# Patient Record
Sex: Female | Born: 1965 | Race: White | Hispanic: No | Marital: Married | Smoking: Never smoker
Health system: Southern US, Community
[De-identification: ages and names within clinical notes are randomized; demographics above are authoritative.]

## PROBLEM LIST (undated history)

## (undated) DIAGNOSIS — K5090 Crohn's disease, unspecified, without complications: Secondary | ICD-10-CM

## (undated) DIAGNOSIS — I10 Essential (primary) hypertension: Secondary | ICD-10-CM

## (undated) DIAGNOSIS — L503 Dermatographic urticaria: Secondary | ICD-10-CM

## (undated) DIAGNOSIS — K219 Gastro-esophageal reflux disease without esophagitis: Secondary | ICD-10-CM

## (undated) DIAGNOSIS — M3320 Polymyositis, organ involvement unspecified: Secondary | ICD-10-CM

## (undated) DIAGNOSIS — M1990 Unspecified osteoarthritis, unspecified site: Secondary | ICD-10-CM

## (undated) DIAGNOSIS — K529 Noninfective gastroenteritis and colitis, unspecified: Secondary | ICD-10-CM

## (undated) DIAGNOSIS — Z87442 Personal history of urinary calculi: Secondary | ICD-10-CM

## (undated) HISTORY — PX: UPPER GASTROINTESTINAL ENDOSCOPY: SHX188

## (undated) HISTORY — DX: Dermatographic urticaria: L50.3

## (undated) HISTORY — DX: Essential (primary) hypertension: I10

## (undated) HISTORY — DX: Crohn's disease, unspecified, without complications: K50.90

## (undated) HISTORY — DX: Noninfective gastroenteritis and colitis, unspecified: K52.9

## (undated) HISTORY — PX: WISDOM TOOTH EXTRACTION: SHX21

---

## 1985-07-23 HISTORY — PX: APPENDECTOMY: SHX54

## 2003-12-22 ENCOUNTER — Emergency Department (HOSPITAL_COMMUNITY): Admission: EM | Admit: 2003-12-22 | Discharge: 2003-12-22 | Payer: Self-pay | Admitting: Emergency Medicine

## 2004-08-20 ENCOUNTER — Emergency Department (HOSPITAL_COMMUNITY): Admission: EM | Admit: 2004-08-20 | Discharge: 2004-08-20 | Payer: Self-pay | Admitting: Emergency Medicine

## 2004-09-04 ENCOUNTER — Ambulatory Visit: Payer: Self-pay | Admitting: Internal Medicine

## 2004-10-19 ENCOUNTER — Ambulatory Visit: Payer: Self-pay | Admitting: Internal Medicine

## 2004-11-01 ENCOUNTER — Ambulatory Visit (HOSPITAL_COMMUNITY): Admission: RE | Admit: 2004-11-01 | Discharge: 2004-11-01 | Payer: Self-pay | Admitting: Internal Medicine

## 2005-06-06 ENCOUNTER — Ambulatory Visit: Payer: Self-pay | Admitting: Internal Medicine

## 2006-05-30 ENCOUNTER — Ambulatory Visit: Payer: Self-pay | Admitting: Internal Medicine

## 2006-06-10 ENCOUNTER — Ambulatory Visit: Payer: Self-pay | Admitting: Internal Medicine

## 2012-08-23 DIAGNOSIS — L503 Dermatographic urticaria: Secondary | ICD-10-CM

## 2012-08-23 HISTORY — DX: Dermatographic urticaria: L50.3

## 2013-09-21 ENCOUNTER — Ambulatory Visit (INDEPENDENT_AMBULATORY_CARE_PROVIDER_SITE_OTHER): Payer: BC Managed Care – PPO | Admitting: Internal Medicine

## 2013-09-21 ENCOUNTER — Encounter (INDEPENDENT_AMBULATORY_CARE_PROVIDER_SITE_OTHER): Payer: Self-pay | Admitting: Internal Medicine

## 2013-09-21 VITALS — BP 104/70 | HR 84 | Temp 98.8°F | Ht 63.0 in | Wt 120.5 lb

## 2013-09-21 DIAGNOSIS — K5090 Crohn's disease, unspecified, without complications: Secondary | ICD-10-CM

## 2013-09-21 NOTE — Patient Instructions (Signed)
CBC, CRP. Further recommendation to follow.

## 2013-09-21 NOTE — Progress Notes (Signed)
Subjective:     Patient ID: Kristen Cooper, female   DOB: [birth year], 48 y.o.   MRN: [id number]  HPI Three weeks ago she had pain rt mid abdomen. The pain last about 24 hrs. She took Oxycodone and phenergan x 1 day.  She put her self on a liquid diet. She thinks this was a Crohn's flare.  Last Sunday, she had a small amt rt mid abdomen. She does have some bloating. She thinks this is maybe her 3rd flare. She denies any pain for over a week. She is on a liquid diet with some solids.  She has a BM daily. Her stools are either loose or they are formed which is normal for her. Appetite is good for the most part. No weight loss.  She feels 95% better. She has been off the Pentasa about 5 yrs ago. She took herself off her medication.  She does not want to go back on the Pentasa.  Hx of small bowel Crohn's disease. She was diagnosed in 1987 per Dr. Olevia Perches notes.  Last seen in January of 2010. Review of Systems     Past Medical History  Diagnosis Date  . Inflammatory bowel disease   . Crohn disease   . Hypertension     Past Surgical History  Procedure Laterality Date  . Appendectomy  1987    No Known Allergies  No current outpatient prescriptions on file prior to visit.   No current facility-administered medications on file prior to visit.     Objective:   Physical Exam  Filed Vitals:   09/21/13 1553  BP: 104/70  Pulse: 84  Temp: 98.8 F (37.1 C)  Height: 5' 3"  (1.6 m)  Weight: 120 lb 8 oz (54.658 kg)   Alert and oriented. Skin warm and dry. Oral mucosa is moist.   . Sclera anicteric, conjunctivae is pink. Thyroid not enlarged. No cervical lymphadenopathy. Lungs clear. Heart regular rate and rhythm.  Abdomen is soft. Bowel sounds are positive. No hepatomegaly. No abdominal masses felt. No tenderness.  No edema to lower extremities.      Assessment:   Crohn's disease. Today she c/o some bloating. No abdominal pain at this time.    Plan:    CBC, CRP. Further recommendations  to follow.  She was taking Pentasa 519m, 2 caps four times a day.  Has been off for about 5 yrs. If she is having a flair, will restart if she is agreeable.

## 2013-09-22 LAB — CBC WITH DIFFERENTIAL/PLATELET
Basophils Absolute: 0.1 10*3/uL (ref 0.0–0.1)
Basophils Relative: 1 % (ref 0–1)
EOS ABS: 0.1 10*3/uL (ref 0.0–0.7)
EOS PCT: 1 % (ref 0–5)
HEMATOCRIT: 42.6 % (ref 36.0–46.0)
HEMOGLOBIN: 14.6 g/dL (ref 12.0–15.0)
LYMPHS ABS: 3.1 10*3/uL (ref 0.7–4.0)
LYMPHS PCT: 44 % (ref 12–46)
MCH: 31.5 pg (ref 26.0–34.0)
MCHC: 34.3 g/dL (ref 30.0–36.0)
MCV: 92 fL (ref 78.0–100.0)
MONO ABS: 0.6 10*3/uL (ref 0.1–1.0)
MONOS PCT: 8 % (ref 3–12)
Neutro Abs: 3.2 10*3/uL (ref 1.7–7.7)
Neutrophils Relative %: 46 % (ref 43–77)
Platelets: 383 10*3/uL (ref 150–400)
RBC: 4.63 MIL/uL (ref 3.87–5.11)
RDW: 13.5 % (ref 11.5–15.5)
WBC: 7 10*3/uL (ref 4.0–10.5)

## 2013-09-22 LAB — C-REACTIVE PROTEIN: CRP: <0.5 mg/dL (ref <0.60)

## 2013-09-25 ENCOUNTER — Other Ambulatory Visit (INDEPENDENT_AMBULATORY_CARE_PROVIDER_SITE_OTHER): Payer: Self-pay | Admitting: Internal Medicine

## 2013-09-25 DIAGNOSIS — K5090 Crohn's disease, unspecified, without complications: Secondary | ICD-10-CM

## 2013-09-25 MED ORDER — MESALAMINE ER 500 MG PO CPCR: 1000.0000 mg | ORAL_CAPSULE | Freq: Four times a day (QID) | ORAL | Status: DC

## 2014-05-24 ENCOUNTER — Other Ambulatory Visit (INDEPENDENT_AMBULATORY_CARE_PROVIDER_SITE_OTHER): Payer: Self-pay | Admitting: Internal Medicine

## 2014-05-27 ENCOUNTER — Telehealth (INDEPENDENT_AMBULATORY_CARE_PROVIDER_SITE_OTHER): Payer: Self-pay | Admitting: Internal Medicine

## 2014-05-27 MED ORDER — MESALAMINE ER 500 MG PO CPCR: ORAL_CAPSULE | ORAL | Status: DC

## 2014-05-27 NOTE — Telephone Encounter (Signed)
Rx eprescribed.

## 2014-06-30 ENCOUNTER — Encounter (INDEPENDENT_AMBULATORY_CARE_PROVIDER_SITE_OTHER): Payer: Self-pay | Admitting: *Deleted

## 2014-09-29 ENCOUNTER — Encounter (INDEPENDENT_AMBULATORY_CARE_PROVIDER_SITE_OTHER): Payer: Self-pay | Admitting: *Deleted

## 2014-10-11 ENCOUNTER — Ambulatory Visit (INDEPENDENT_AMBULATORY_CARE_PROVIDER_SITE_OTHER): Payer: BC Managed Care – PPO | Admitting: Internal Medicine

## 2014-11-03 ENCOUNTER — Other Ambulatory Visit (INDEPENDENT_AMBULATORY_CARE_PROVIDER_SITE_OTHER): Payer: Self-pay | Admitting: Internal Medicine

## 2014-11-29 ENCOUNTER — Ambulatory Visit (INDEPENDENT_AMBULATORY_CARE_PROVIDER_SITE_OTHER): Payer: BC Managed Care – PPO | Admitting: Internal Medicine

## 2014-11-29 ENCOUNTER — Encounter (INDEPENDENT_AMBULATORY_CARE_PROVIDER_SITE_OTHER): Payer: Self-pay | Admitting: Internal Medicine

## 2014-11-29 VITALS — BP 110/68 | HR 83 | Temp 98.8°F | Resp 18 | Ht 63.0 in | Wt 124.6 lb

## 2014-11-29 DIAGNOSIS — K5090 Crohn's disease, unspecified, without complications: Secondary | ICD-10-CM

## 2014-11-29 DIAGNOSIS — K5000 Crohn's disease of small intestine without complications: Secondary | ICD-10-CM

## 2014-11-29 LAB — COMPREHENSIVE METABOLIC PANEL
ALBUMIN: 4 g/dL (ref 3.5–5.2)
ALT: 24 U/L (ref 0–35)
AST: 22 U/L (ref 0–37)
Alkaline Phosphatase: 57 U/L (ref 39–117)
BILIRUBIN TOTAL: 0.8 mg/dL (ref 0.2–1.2)
BUN: 16 mg/dL (ref 6–23)
CALCIUM: 9.3 mg/dL (ref 8.4–10.5)
CHLORIDE: 102 meq/L (ref 96–112)
CO2: 27 meq/L (ref 19–32)
Creat: 0.87 mg/dL (ref 0.50–1.10)
GLUCOSE: 80 mg/dL (ref 70–99)
Potassium: 3.8 mEq/L (ref 3.5–5.3)
SODIUM: 141 meq/L (ref 135–145)
TOTAL PROTEIN: 7 g/dL (ref 6.0–8.3)

## 2014-11-29 LAB — CBC
HEMATOCRIT: 43.7 % (ref 36.0–46.0)
HEMOGLOBIN: 14.6 g/dL (ref 12.0–15.0)
MCH: 30.1 pg (ref 26.0–34.0)
MCHC: 33.4 g/dL (ref 30.0–36.0)
MCV: 90.1 fL (ref 78.0–100.0)
MPV: 9.1 fL (ref 8.6–12.4)
Platelets: 384 10*3/uL (ref 150–400)
RBC: 4.85 MIL/uL (ref 3.87–5.11)
RDW: 12.9 % (ref 11.5–15.5)
WBC: 8.9 10*3/uL (ref 4.0–10.5)

## 2014-11-29 LAB — C-REACTIVE PROTEIN: CRP: 1.2 mg/dL — AB (ref <0.60)

## 2014-11-29 MED ORDER — MESALAMINE ER 500 MG PO CPCR: ORAL_CAPSULE | ORAL | Status: DC

## 2014-11-29 NOTE — Patient Instructions (Signed)
Physician will call with results of blood tests when completed. Please keep symptom diary as discussed. Call if he have diarrhea abdominal pain lasting for more than 2-3 days.

## 2014-11-29 NOTE — Progress Notes (Signed)
Presenting complaint;  Follow-up for Crohn's disease.  Subjective:  Kristen Cooper is 49 year old Caucasian female with history of small bowel Crohn's disease is here for scheduled visit. She was last seen in March 2015. She states she is doing well. On most days she has normal stools. Every now and then she has diarrhea and if she stays on liquid diet she gets better after a day or two. She denies melena or rectal bleeding. She has good appetite and her waist been stable. She does not take OTC NSAIDs. She did not get yearly blood work offered to her through the school this year.   Current Medications: Outpatient Encounter Prescriptions as of 11/29/2014  Medication Sig  . acetaminophen (TYLENOL) 325 MG tablet Take 650 mg by mouth as needed.  . cetirizine (ZYRTEC) 10 MG tablet Take 10 mg by mouth daily.  . fexofenadine (ALLEGRA) 180 MG tablet Take 180 mg by mouth daily.  . Levonorgestrel-Ethinyl Estrad (AVIANE PO) Take by mouth daily.  Marland Kitchen lisinopril-hydrochlorothiazide (PRINZIDE,ZESTORETIC) 10-12.5 MG per tablet Take 1 tablet by mouth daily.  . mesalamine (PENTASA) 500 MG CR capsule TAKE (2) CAPSULES FOUR TIMES DAILY.  . [DISCONTINUED] PENTASA 500 MG CR capsule TAKE (2) CAPSULES FOUR TIMES DAILY. (Patient not taking: Reported on 11/29/2014)   No facility-administered encounter medications on file as of 11/29/2014.     Objective: Blood pressure 110/68, pulse 83, temperature 98.8 F (37.1 C), temperature source Oral, resp. rate 18, height 5' 3"  (1.6 m), weight 124 lb 9.6 oz (56.518 kg). Patient is alert and in no acute distress. Conjunctiva is pink. Sclera is nonicteric Oropharyngeal mucosa is normal. No neck masses or thyromegaly noted. Cardiac exam with regular rhythm normal S1 and S2. No murmur or gallop noted. Lungs are clear to auscultation. Abdomen is symmetrical and soft with mild tenderness at RLQ on deep palpation. No organomegaly or masses. No LE edema or clubbing noted.  Labs/studies  Results: Lab data from 09/21/2013  WBC 7.0, H&H 14.6 and 42.6 and platelet count 383K  CRP less than 0.5.    Assessment:  #1. Small bowel Crohn's disease. She appears to be in remission. She is presently on oral mesalamine. She is due for lab studies.  Plan:  New prescription given for Pentasa 1 g by mouth 4 times a day one month with 11 refills. Patient will go to the lab for CBC, comprehensive chemistry panel and CRP. Office visit in one year unless symptoms relapse. She will undergo screening colonoscopy next year.

## 2015-08-18 ENCOUNTER — Encounter (INDEPENDENT_AMBULATORY_CARE_PROVIDER_SITE_OTHER): Payer: Self-pay | Admitting: Internal Medicine

## 2015-09-06 ENCOUNTER — Other Ambulatory Visit (INDEPENDENT_AMBULATORY_CARE_PROVIDER_SITE_OTHER): Payer: Self-pay | Admitting: Internal Medicine

## 2015-09-06 ENCOUNTER — Ambulatory Visit (INDEPENDENT_AMBULATORY_CARE_PROVIDER_SITE_OTHER): Payer: BC Managed Care – PPO | Admitting: Internal Medicine

## 2015-09-06 ENCOUNTER — Encounter (INDEPENDENT_AMBULATORY_CARE_PROVIDER_SITE_OTHER): Payer: Self-pay | Admitting: Internal Medicine

## 2015-09-06 ENCOUNTER — Encounter (INDEPENDENT_AMBULATORY_CARE_PROVIDER_SITE_OTHER): Payer: Self-pay | Admitting: *Deleted

## 2015-09-06 ENCOUNTER — Other Ambulatory Visit (INDEPENDENT_AMBULATORY_CARE_PROVIDER_SITE_OTHER): Payer: Self-pay | Admitting: *Deleted

## 2015-09-06 VITALS — BP 104/58 | HR 72 | Temp 98.5°F | Ht 63.0 in | Wt 115.3 lb

## 2015-09-06 DIAGNOSIS — K5000 Crohn's disease of small intestine without complications: Secondary | ICD-10-CM

## 2015-09-06 NOTE — Progress Notes (Signed)
   Subjective:    Patient ID: Kristen Cooper, female    DOB: August 06, 1965, 50 y.o.   MRN: [id number]  HPI  Presents today with c/o problems eating. She tells me she has a lot of heartburn.  She says she feels better today.  She has some diarrhea. No blood.  She has not had a fever.  She is having diarrhea. She is having about 5-6 stools a day.  Usually has one stool today. She says she has a twinge in her rt mid abdomen off and on. Her appetite is not good. She says she is not hungry. She cannot eat raw vegetables. She has lost about 9 pounds in the past 2 weeks. She thinks she had a stomach virus last week and the week before. She has some weakness. Per records she was diagnosed with Crohn's in 1982. Hx of chronic, vague rt lower quadrant abdominal pain.    Hx of Crohn's disease and is maintained on Pentasa 2 tabs qid. She is due for a screening colonoscopy.  Review of Systems Past Medical History  Diagnosis Date  . Inflammatory bowel disease   . Crohn disease ([place])   . Hypertension   . Dermatography 08/2012    To many histamine cells    Past Surgical History  Procedure Laterality Date  . Appendectomy  1987  . Colonoscopy    . Upper gastrointestinal endoscopy      No Known Allergies  Current Outpatient Prescriptions on File Prior to Visit  Medication Sig Dispense Refill  . acetaminophen (TYLENOL) 325 MG tablet Take 650 mg by mouth as needed.    . cetirizine (ZYRTEC) 10 MG tablet Take 10 mg by mouth daily.    . fexofenadine (ALLEGRA) 180 MG tablet Take 180 mg by mouth daily.    . Levonorgestrel-Ethinyl Estrad (AVIANE PO) Take by mouth daily.    Marland Kitchen lisinopril-hydrochlorothiazide (PRINZIDE,ZESTORETIC) 10-12.5 MG per tablet Take 1 tablet by mouth daily.    . mesalamine (PENTASA) 500 MG CR capsule TAKE (2) CAPSULES FOUR TIMES DAILY. (Patient taking differently: 500 mg. TAKE (2) CAPSULES FOUR TIMES DAILY.) 240 capsule 11   No current facility-administered medications on file prior to  visit.        Objective:   Physical Exam Blood pressure 104/58, pulse 72, temperature 98.5 F (36.9 C), height 5\' 3"  (1.6 m), weight 115 lb 4.8 oz (52.3 kg). Alert and oriented. Skin warm and dry. Oral mucosa is moist.   . Sclera anicteric, conjunctivae is pink. Thyroid not enlarged. No cervical lymphadenopathy. Lungs clear. Heart regular rate and rhythm.  Abdomen is soft. Bowel sounds are positive. No hepatomegaly. No abdominal masses felt.Very slight tenderness rt mid abdomen..  No edema to lower extremities.          Assessment & Plan:  Diarrhea. Slight pain rt mid abdomen. Will get a CBC and sedrate.  Imodium as needed for the diarrhea. Will schedule a screening colonoscopy 2-3 months out.

## 2015-09-06 NOTE — Patient Instructions (Addendum)
CBC, sedrate.  Imodium as needed for diarrhea.

## 2015-09-06 NOTE — Telephone Encounter (Signed)
Patient needs trilyte 

## 2015-09-07 LAB — CBC WITH DIFFERENTIAL/PLATELET
BASOS ABS: 0 10*3/uL (ref 0.0–0.1)
BASOS PCT: 0 % (ref 0–1)
EOS ABS: 0.1 10*3/uL (ref 0.0–0.7)
Eosinophils Relative: 1 % (ref 0–5)
HCT: 36.7 % (ref 36.0–46.0)
Hemoglobin: 12 g/dL (ref 12.0–15.0)
Lymphocytes Relative: 27 % (ref 12–46)
Lymphs Abs: 2.5 10*3/uL (ref 0.7–4.0)
MCH: 28.8 pg (ref 26.0–34.0)
MCHC: 32.7 g/dL (ref 30.0–36.0)
MCV: 88 fL (ref 78.0–100.0)
MPV: 9.6 fL (ref 8.6–12.4)
Monocytes Absolute: 0.7 10*3/uL (ref 0.1–1.0)
Monocytes Relative: 8 % (ref 3–12)
NEUTROS ABS: 5.8 10*3/uL (ref 1.7–7.7)
NEUTROS PCT: 64 % (ref 43–77)
PLATELETS: 480 10*3/uL — AB (ref 150–400)
RBC: 4.17 MIL/uL (ref 3.87–5.11)
RDW: 13.2 % (ref 11.5–15.5)
WBC: 9.1 10*3/uL (ref 4.0–10.5)

## 2015-09-07 LAB — C-REACTIVE PROTEIN: CRP: 3.9 mg/dL — AB (ref <0.60)

## 2015-09-07 MED ORDER — PEG 3350-KCL-NA BICARB-NACL 420 G PO SOLR: 4000.0000 mL | Freq: Once | ORAL | Status: DC

## 2015-09-08 ENCOUNTER — Telehealth (INDEPENDENT_AMBULATORY_CARE_PROVIDER_SITE_OTHER): Payer: Self-pay | Admitting: Internal Medicine

## 2015-09-08 NOTE — Telephone Encounter (Signed)
Ms. Kamphuis left a message saying she'd like a phone call so she can get the results of her most recent labs. Please give her a call.  Pt's ph# [phone]  Thank you.

## 2015-09-13 NOTE — Telephone Encounter (Signed)
I have talked with patient

## 2015-09-16 ENCOUNTER — Telehealth (INDEPENDENT_AMBULATORY_CARE_PROVIDER_SITE_OTHER): Payer: Self-pay | Admitting: Internal Medicine

## 2015-09-16 NOTE — Telephone Encounter (Signed)
Error

## 2015-09-19 NOTE — Telephone Encounter (Signed)
Continue the Cipro 500mg  BID, and flagyl 250mg  TID. Prednisone 30mg  and taper by 5mg  weekslyl She tellls me today she feels better.  No abdominal pain. If anything changes she will call me

## 2015-10-27 ENCOUNTER — Ambulatory Visit (HOSPITAL_COMMUNITY)
Admission: RE | Admit: 2015-10-27 | Discharge: 2015-10-27 | Disposition: A | Discharge status: Home / Self Care | Payer: BC Managed Care – PPO | Source: Ambulatory Visit | Attending: Internal Medicine | Admitting: Internal Medicine

## 2015-10-27 ENCOUNTER — Encounter (HOSPITAL_COMMUNITY): Payer: Self-pay

## 2015-10-27 ENCOUNTER — Encounter (HOSPITAL_COMMUNITY)
Admission: RE | Disposition: A | Discharge status: Home / Self Care | Payer: Self-pay | Source: Ambulatory Visit | Attending: Internal Medicine

## 2015-10-27 DIAGNOSIS — Z8379 Family history of other diseases of the digestive system: Secondary | ICD-10-CM | POA: Insufficient documentation

## 2015-10-27 DIAGNOSIS — K633 Ulcer of intestine: Secondary | ICD-10-CM

## 2015-10-27 DIAGNOSIS — D125 Benign neoplasm of sigmoid colon: Secondary | ICD-10-CM | POA: Insufficient documentation

## 2015-10-27 DIAGNOSIS — Z7952 Long term (current) use of systemic steroids: Secondary | ICD-10-CM | POA: Insufficient documentation

## 2015-10-27 DIAGNOSIS — K6389 Other specified diseases of intestine: Secondary | ICD-10-CM | POA: Diagnosis not present

## 2015-10-27 DIAGNOSIS — K644 Residual hemorrhoidal skin tags: Secondary | ICD-10-CM | POA: Insufficient documentation

## 2015-10-27 DIAGNOSIS — I10 Essential (primary) hypertension: Secondary | ICD-10-CM | POA: Diagnosis not present

## 2015-10-27 DIAGNOSIS — K5000 Crohn's disease of small intestine without complications: Secondary | ICD-10-CM

## 2015-10-27 DIAGNOSIS — Z793 Long term (current) use of hormonal contraceptives: Secondary | ICD-10-CM | POA: Insufficient documentation

## 2015-10-27 DIAGNOSIS — Z79899 Other long term (current) drug therapy: Secondary | ICD-10-CM | POA: Insufficient documentation

## 2015-10-27 DIAGNOSIS — Z1211 Encounter for screening for malignant neoplasm of colon: Secondary | ICD-10-CM | POA: Diagnosis not present

## 2015-10-27 DIAGNOSIS — K5080 Crohn's disease of both small and large intestine without complications: Secondary | ICD-10-CM | POA: Diagnosis not present

## 2015-10-27 HISTORY — PX: COLONOSCOPY: SHX5424

## 2015-10-27 LAB — COMPREHENSIVE METABOLIC PANEL
ALT: 19 U/L (ref 14–54)
AST: 20 U/L (ref 15–41)
Albumin: 3.8 g/dL (ref 3.5–5.0)
Alkaline Phosphatase: 52 U/L (ref 38–126)
Anion gap: 11 (ref 5–15)
BILIRUBIN TOTAL: 1.3 mg/dL — AB (ref 0.3–1.2)
BUN: 15 mg/dL (ref 6–20)
CO2: 21 mmol/L — ABNORMAL LOW (ref 22–32)
CREATININE: 1 mg/dL (ref 0.44–1.00)
Calcium: 8.4 mg/dL — ABNORMAL LOW (ref 8.9–10.3)
Chloride: 107 mmol/L (ref 101–111)
GFR calc Af Amer: >60 mL/min (ref >60)
Glucose, Bld: 78 mg/dL (ref 65–99)
Potassium: 3.2 mmol/L — ABNORMAL LOW (ref 3.5–5.1)
Sodium: 139 mmol/L (ref 135–145)
TOTAL PROTEIN: 6.8 g/dL (ref 6.5–8.1)

## 2015-10-27 LAB — C-REACTIVE PROTEIN: CRP: 0.8 mg/dL (ref <1.0)

## 2015-10-27 SURGERY — COLONOSCOPY
Anesthesia: Moderate Sedation

## 2015-10-27 MED ORDER — MEPERIDINE HCL 50 MG/ML IJ SOLN
INTRAMUSCULAR | Status: DC | PRN
  Administered 2015-10-27 (×2): 25 mg via INTRAVENOUS

## 2015-10-27 MED ORDER — SODIUM CHLORIDE 0.9 % IV SOLN
INTRAVENOUS | Status: DC
  Administered 2015-10-27: 11:00:00 via INTRAVENOUS

## 2015-10-27 MED ORDER — MIDAZOLAM HCL 5 MG/5ML IJ SOLN
INTRAMUSCULAR | Status: DC
Start: 2015-10-27 — End: 2015-10-27
  Filled 2015-10-27: qty 10

## 2015-10-27 MED ORDER — PREDNISONE 10 MG PO TABS: 10.0000 mg | ORAL_TABLET | Freq: Every day | ORAL | Status: AC

## 2015-10-27 MED ORDER — STERILE WATER FOR IRRIGATION IR SOLN
Status: DC | PRN
  Administered 2015-10-27: 11:00:00

## 2015-10-27 MED ORDER — MEPERIDINE HCL 50 MG/ML IJ SOLN
INTRAMUSCULAR | Status: AC
  Filled 2015-10-27: qty 1

## 2015-10-27 MED ORDER — MIDAZOLAM HCL 5 MG/5ML IJ SOLN
INTRAMUSCULAR | Status: DC | PRN
  Administered 2015-10-27 (×2): 2 mg via INTRAVENOUS
  Administered 2015-10-27: 1 mg via INTRAVENOUS
  Administered 2015-10-27: 2 mg via INTRAVENOUS
  Administered 2015-10-27: 1 mg via INTRAVENOUS
  Administered 2015-10-27: 2 mg via INTRAVENOUS

## 2015-10-27 NOTE — Discharge Instructions (Signed)
Increase prednisone to 10 mg by mouth daily with breakfast. Resume other medications as before. Calcium with vitamin D two tablets daily(calcium dose 1000 mg per day). No driving for 24 hours. Physician will call with results of biopsy and blood test.   Colonoscopy, Care After Refer to this sheet in the next few weeks. These instructions provide you with information on caring for yourself after your procedure. Your health care provider may also give you more specific instructions. Your treatment has been planned according to current medical practices, but problems sometimes occur. Call your health care provider if you have any problems or questions after your procedure. WHAT TO EXPECT AFTER THE PROCEDURE  After your procedure, it is typical to have the following:  A small amount of blood in your stool.  Moderate amounts of gas and mild abdominal cramping or bloating. HOME CARE INSTRUCTIONS  Do not drive, operate machinery, or sign important documents for 24 hours.  You may shower and resume your regular physical activities, but move at a slower pace for the first 24 hours.  Take frequent rest periods for the first 24 hours.  Walk around or put a warm pack on your abdomen to help reduce abdominal cramping and bloating.  Drink enough fluids to keep your urine clear or pale yellow.  You may resume your normal diet as instructed by your health care provider. Avoid heavy or fried foods that are hard to digest.  Avoid drinking alcohol for 24 hours or as instructed by your health care provider.  Only take over-the-counter or prescription medicines as directed by your health care provider.  If a tissue sample (biopsy) was taken during your procedure:  Do not take aspirin or blood thinners for 7 days, or as instructed by your health care provider.  Do not drink alcohol for 7 days, or as instructed by your health care provider.  Eat soft foods for the first 24 hours. SEEK MEDICAL CARE  IF: You have persistent spotting of blood in your stool 2-3 days after the procedure. SEEK IMMEDIATE MEDICAL CARE IF:  You have more than a small spotting of blood in your stool.  You pass large blood clots in your stool.  Your abdomen is swollen (distended).  You have nausea or vomiting.  You have a fever.  You have increasing abdominal pain that is not relieved with medicine.   This information is not intended to replace advice given to you by your health care provider. Make sure you discuss any questions you have with your health care provider.   Document Released: 02/21/2004 Document Revised: 04/29/2013 Document Reviewed: 03/16/2013 Elsevier Interactive Patient Education [employer].

## 2015-10-27 NOTE — H&P (Signed)
Kristen Cooper is an 50 y.o. female.   Chief Complaint: Patient is here for colonoscopy. HPI: She is 50 year old Caucasian female who is here for colonoscopy for screening/surveillance purposes. She has history of small bowel Crohn's disease which was diagnosed, appendectomy 1987. She was treated for relapse about 8 weeks ago. She is down to prednisone 5 mg daily. She denies abdominal pain rectal bleeding. She has intermittent diarrhea. She states she had lost close to 20 pounds and she may have gained some back. Her appetite is improved. She does not take OTC NSAIDs. Family History significant for Crohn disease in mother.  Past Medical History  Diagnosis Date  . Inflammatory bowel disease   . Crohn disease ([place])   . Hypertension   . Dermatography 08/2012    To many histamine cells    Past Surgical History  Procedure Laterality Date  . Appendectomy  1987  . Colonoscopy    . Upper gastrointestinal endoscopy      Family History  Problem Relation Age of Onset  . Crohn's disease Mother   . Irritable bowel syndrome Father   . Hypertension Brother   . Healthy Brother   . Healthy Brother    Social History:  reports that she has never smoked. She has never used smokeless tobacco. She reports that she does not drink alcohol or use illicit drugs.  Allergies: No Known Allergies  Medications Prior to Admission  Medication Sig Dispense Refill  . cetirizine (ZYRTEC) 10 MG tablet Take 10 mg by mouth daily.    . fexofenadine (ALLEGRA) 180 MG tablet Take 180 mg by mouth daily.    . Levonorgestrel-Ethinyl Estrad (AVIANE PO) Take by mouth daily.    Marland Kitchen lisinopril-hydrochlorothiazide (PRINZIDE,ZESTORETIC) 10-12.5 MG per tablet Take 1 tablet by mouth daily.    . mesalamine (PENTASA) 500 MG CR capsule TAKE (2) CAPSULES FOUR TIMES DAILY. (Patient taking differently: 500 mg. TAKE (2) CAPSULES FOUR TIMES DAILY.) 240 capsule 11  . polyethylene glycol-electrolytes (NULYTELY/GOLYTELY) 420 g solution Take  4,000 mLs by mouth once. (Patient taking differently: Take 4,000 mLs by mouth once. Prior to colonoscopy) 4000 mL 0  . predniSONE (DELTASONE) 10 MG tablet Take 10 mg by mouth daily with breakfast. Taking taper.  Is down to 1 pill daily thru Saturday    . acetaminophen (TYLENOL) 325 MG tablet Take 650 mg by mouth as needed.      No results found for this or any previous visit (from the past 48 hour(s)). No results found.  ROS  Blood pressure 115/64, pulse 110, temperature 98.7 F (37.1 C), temperature source Oral, resp. rate 12, height 5' 2"  (1.575 m), weight 105 lb (47.628 kg), last menstrual period 10/10/2015, SpO2 100 %. Physical Exam  Constitutional:  Well-developed thin Caucasian female in NAD.  HENT:  Mouth/Throat: Oropharynx is clear and moist.  Eyes: Conjunctivae are normal. No scleral icterus.  Neck: No thyromegaly present.  Cardiovascular: Normal rate, regular rhythm and normal heart sounds.   No murmur heard. Respiratory: Effort normal and breath sounds normal.  GI:  Abdomen is flat and soft with mild tenderness at RLQ.  Musculoskeletal: She exhibits no edema.  Lymphadenopathy:    She has no cervical adenopathy.  Neurological: She is alert.  Skin: Skin is warm and dry.     Assessment/Plan History of small bowel Crohn's disease. Screening/surveillance colonoscopy.  Rogene Houston, MD 10/27/2015, 10:58 AM

## 2015-10-27 NOTE — Op Note (Signed)
[facility] Patient Name: Kristen Cooper Procedure Date: 10/27/2015 10:53 AM MRN: [id number] Date of Birth: 04/17/1966 Attending MD: Hildred Laser , MD CSN: [id number] Age: 50 Admit Type: Outpatient Procedure:                Colonoscopy Indications:              Screening for colorectal malignant neoplasm, High                            risk colon cancer surveillance: Crohn's disease                            small intestine Providers:                Hildred Laser, MD, Rometta Emery, RN, Bonnetta Barry, Technician Referring MD:             Kassie Mends PA-C Medicines:                Meperidine 50 mg IV, Midazolam 10 mg IV Complications:            No immediate complications. Estimated Blood Loss:     Estimated blood loss was minimal. Procedure:                Pre-Anesthesia Assessment:                           - Prior to the procedure, a History and Physical                            was performed, and patient medications and                            allergies were reviewed. The patient's tolerance of                            previous anesthesia was also reviewed. The risks                            and benefits of the procedure and the sedation                            options and risks were discussed with the patient.                            All questions were answered, and informed consent                            was obtained. Prior Anticoagulants: The patient has                            taken no previous anticoagulant or antiplatelet  agents. ASA Grade Assessment: II - A patient with                            mild systemic disease. After reviewing the risks                            and benefits, the patient was deemed in                            satisfactory condition to undergo the procedure.                           After obtaining informed consent, the colonoscope                            was passed  under direct vision. Throughout the                            procedure, the patient's blood pressure, pulse, and                            oxygen saturations were monitored continuously. The                            EC-[id number] ([id number]) scope was introduced through                            the anus and advanced to the the terminal ileum,                            with identification of the appendiceal orifice and                            IC valve. The colonoscopy was somewhat difficult                            due to a tortuous colon. Successful completion of                            the procedure was aided by changing the patient to                            a supine position. The terminal ileum, ileocecal                            valve, appendiceal orifice, and rectum were                            photographed. The quality of the bowel preparation                            was adequate. Scope In: 11:11:36 AM Scope Out: 46:56:81 AM Scope Withdrawal Time: 0 hours 18 minutes 50 seconds  Total Procedure Duration: 0 hours 37 minutes 33 seconds  Findings:      The terminal ileum contained a few non-bleeding erosions. No stigmata of       recent bleeding were seen. Biopsies were taken with a cold forceps for       histology.      A [Extent] area of [Severity] [Features] mucosa was found in the cecum.      A scattered area of erythematous, eroded and ulcerated mucosa was found       in the sigmoid colon, in the descending colon, in the transverse colon,       in the ascending colon and in the cecum. Biopsies were taken with a cold       forceps for histology.      A 6 mm polyp was found in the sigmoid colon. The polyp was sessile. The       polyp was removed with a cold snare. Resection and retrieval were       complete.      External hemorrhoids were found during retroflexion. The hemorrhoids       were small. Impression:               - A few erosions in the terminal  ileum. Biopsied.                           - Erythematous, eroded and ulcerated mucosa in the                            sigmoid colon, in the descending colon, in the                            transverse colon, in the ascending colon and in the                            cecum. Biopsied.                           - One 6 mm polyp in the sigmoid colon, removed with                            a cold snare. Resected and retrieved.                           - External hemorrhoids.                           - Biopsies taken from terminal ileum, cecum and                            sigmoid colon. Moderate Sedation:      Moderate (conscious) sedation was administered by the endoscopy nurse       and supervised by the endoscopist. The following parameters were       monitored: oxygen saturation, heart rate, blood pressure, CO2       capnography and response to care. Total physician intraservice time was       45 minutes. Recommendation:           - Resume  previous diet today.                           - Continue present medicationsbut increase                            prednisone to 10 mg by mouth daily.                           - Await pathology results.                           - No aspirin, ibuprofen, naproxen, or other                            non-steroidal anti-inflammatory drugs.                           - Will repeat CRP today along with chemistry panel                            and TPMT assay.                           - Repeat colonoscopy is recommended. The                            colonoscopy date will be determined after pathology                            results from today's exam become available for                            review. Procedure Code(s):        --- Professional ---                           [phone], Colonoscopy, flexible; with removal of                            tumor(s), polyp(s), or other lesion(s) by snare                            technique                            45380, 59, Colonoscopy, flexible; with biopsy,                            single or multiple                           99152, Moderate sedation services provided by the                            same physician or other qualified health care  professional performing the diagnostic or                            therapeutic service that the sedation supports,                            requiring the presence of an independent trained                            observer to assist in the monitoring of the                            patient's level of consciousness and physiological                            status; initial 15 minutes of intraservice time,                            patient age 77 years or older                           [phone], Moderate sedation services; each additional                            15 minutes intraservice time                           [phone], Moderate sedation services; each additional                            15 minutes intraservice time Diagnosis Code(s):        --- Professional ---                           Z12.11, Encounter for screening for malignant                            neoplasm of colon                           K50.00, Crohn's disease of small intestine without                            complications                           K63.3, Ulcer of intestine                           K63.89, Other specified diseases of intestine                           D12.5, Benign neoplasm of sigmoid colon                           K64.4, Residual hemorrhoidal skin tags CPT copyright 2016 American Medical Association. All  rights reserved. The codes documented in this report are preliminary and upon coder review may  be revised to meet current compliance requirements. Hildred Laser, MD Hildred Laser, MD 10/27/2015 12:05:45 PM This report has been signed electronically. Number of Addenda: 0

## 2015-10-31 ENCOUNTER — Encounter (HOSPITAL_COMMUNITY): Payer: Self-pay | Admitting: Internal Medicine

## 2015-11-01 LAB — MISC LABCORP TEST (SEND OUT): Labcorp test code: 510750

## 2015-11-06 ENCOUNTER — Other Ambulatory Visit (INDEPENDENT_AMBULATORY_CARE_PROVIDER_SITE_OTHER): Payer: Self-pay | Admitting: Internal Medicine

## 2015-11-06 MED ORDER — POTASSIUM CHLORIDE CRYS ER 20 MEQ PO TBCR: 20.0000 meq | EXTENDED_RELEASE_TABLET | Freq: Every day | ORAL | Status: DC

## 2015-11-07 ENCOUNTER — Telehealth (INDEPENDENT_AMBULATORY_CARE_PROVIDER_SITE_OTHER): Payer: Self-pay | Admitting: *Deleted

## 2015-11-07 DIAGNOSIS — K50918 Crohn's disease, unspecified, with other complication: Secondary | ICD-10-CM

## 2015-11-07 NOTE — Telephone Encounter (Signed)
Per Dr.Rehman the patient will need to have labs drawn in 2 weeks. 

## 2015-11-08 ENCOUNTER — Telehealth (INDEPENDENT_AMBULATORY_CARE_PROVIDER_SITE_OTHER): Payer: Self-pay | Admitting: Internal Medicine

## 2015-11-08 NOTE — Telephone Encounter (Signed)
Patient

## 2015-11-08 NOTE — Telephone Encounter (Signed)
Message forwarded to Dr.Rehman as the patient would like to talk with him about the Remicade.

## 2015-11-08 NOTE — Telephone Encounter (Signed)
Patient called, wanted to touch base with Dr. Laural Golden to decide what treatment to use.  She'd like to try Remicade, but has some questions.  She would like to discuss.  She'll be in a meeting until 4pm, after that she'll be at home.  Home - [phone] Work - [phone]

## 2015-11-09 NOTE — Telephone Encounter (Signed)
Patient's call returned. Patient would like to proceed with Remicade both for induction and maintenance. Will check hepatitis B surface antigen HCV antibody. She will have PPD through PCPs office. Patient will stop by tomorrow morning to pick up the paperwork.

## 2015-11-10 ENCOUNTER — Telehealth (INDEPENDENT_AMBULATORY_CARE_PROVIDER_SITE_OTHER): Payer: Self-pay | Admitting: *Deleted

## 2015-11-10 ENCOUNTER — Encounter (INDEPENDENT_AMBULATORY_CARE_PROVIDER_SITE_OTHER): Payer: Self-pay | Admitting: *Deleted

## 2015-11-10 DIAGNOSIS — K50918 Crohn's disease, unspecified, with other complication: Secondary | ICD-10-CM

## 2015-11-10 NOTE — Telephone Encounter (Signed)
The process has been started for this patient. We are also awaiting the results of her PPD , Hep B surface antigen and HCV antibody. Patient will be made aware as we rec'v outcome of PA.

## 2015-11-10 NOTE — Telephone Encounter (Signed)
Per Dr.Rehman the patient is going to start Remicade. She is going to have PPD @ PCP. Hep B Surface Antigen,HCV Antibody have been ordered.  I called  [employer] /Damien S. A PA was done over phone. Tempory reference # is [id number]. Dates of pending approval are 11/10/15-11/09/16. He is going to fax a from for [place] to complete and send clinical information.  We have rec'd additional paper work from [employer] to complete and send clinicals. We are also awaiting PPD , and additional lab results.

## 2015-11-10 NOTE — Telephone Encounter (Signed)
Per Dr.Rehman the patient will need to have labs drawn. 

## 2015-11-10 NOTE — Addendum Note (Signed)
Addended by: Grayland Ormond on: 11/10/2015 08:10 AM   Modules accepted: Orders

## 2015-11-11 LAB — HEPATITIS C ANTIBODY: HCV Ab: NEGATIVE

## 2015-11-11 LAB — HEPATITIS B SURFACE ANTIGEN: Hepatitis B Surface Ag: NEGATIVE

## 2015-11-16 ENCOUNTER — Encounter (INDEPENDENT_AMBULATORY_CARE_PROVIDER_SITE_OTHER): Payer: Self-pay | Admitting: *Deleted

## 2015-11-16 ENCOUNTER — Other Ambulatory Visit (INDEPENDENT_AMBULATORY_CARE_PROVIDER_SITE_OTHER): Payer: Self-pay | Admitting: *Deleted

## 2015-11-16 DIAGNOSIS — K50918 Crohn's disease, unspecified, with other complication: Secondary | ICD-10-CM

## 2015-11-22 LAB — BASIC METABOLIC PANEL
BUN: 24 mg/dL (ref 7–25)
CO2: 22 mmol/L (ref 20–31)
Calcium: 9.5 mg/dL (ref 8.6–10.4)
Chloride: 103 mmol/L (ref 98–110)
Creat: 0.97 mg/dL (ref 0.50–1.05)
Glucose, Bld: 100 mg/dL — ABNORMAL HIGH (ref 65–99)
Potassium: 3.9 mmol/L (ref 3.5–5.3)
Sodium: 140 mmol/L (ref 135–146)

## 2015-11-24 ENCOUNTER — Telehealth (INDEPENDENT_AMBULATORY_CARE_PROVIDER_SITE_OTHER): Payer: Self-pay | Admitting: *Deleted

## 2015-11-24 NOTE — Telephone Encounter (Signed)
Addressed; See Ms.Todd's note

## 2015-11-24 NOTE — Telephone Encounter (Addendum)
Patient was called and it was explained that she was having another lab to see if her K+ had come up,after having taken Potassium following last lab results. She understood. She did ask if she was to refill her Prednisone she finished what she in on either tomorrow or Saturday.  Also , her first Remicade is on May 9 th , the same day she has a followup with Terri. Patient will go to have Remicade. Dr.Rehman will be made aware and we will see when he wants her to be seen after having been on Remicade.

## 2015-11-24 NOTE — Telephone Encounter (Signed)
Dr.Rehman, Do you want the patient to refill her Prednisone. She will be completing current Rx either tomorrow or Saturday? She is to have her first Remicade Infusion on May 9 th. When would you like to see her in office for a follow-up visit?

## 2015-11-29 ENCOUNTER — Encounter (HOSPITAL_COMMUNITY): Payer: Self-pay

## 2015-11-29 ENCOUNTER — Encounter (HOSPITAL_COMMUNITY)
Admission: RE | Admit: 2015-11-29 | Discharge: 2015-11-29 | Disposition: A | Discharge status: Home / Self Care | Payer: BC Managed Care – PPO | Source: Ambulatory Visit | Attending: Internal Medicine | Admitting: Internal Medicine

## 2015-11-29 ENCOUNTER — Encounter (INDEPENDENT_AMBULATORY_CARE_PROVIDER_SITE_OTHER): Payer: Self-pay

## 2015-11-29 DIAGNOSIS — K5090 Crohn's disease, unspecified, without complications: Secondary | ICD-10-CM | POA: Diagnosis present

## 2015-11-29 MED ORDER — SODIUM CHLORIDE 0.9 % IV SOLN
300.0000 mg | Freq: Once | INTRAVENOUS | Status: AC
  Administered 2015-11-29: 300 mg via INTRAVENOUS
  Filled 2015-11-29: qty 30

## 2015-11-29 MED ORDER — ACETAMINOPHEN 325 MG PO TABS
650.0000 mg | ORAL_TABLET | Freq: Once | ORAL | Status: AC
  Administered 2015-11-29: 650 mg via ORAL

## 2015-11-29 MED ORDER — SODIUM CHLORIDE 0.9 % IV SOLN
INTRAVENOUS | Status: DC
  Administered 2015-11-29: 10:00:00 via INTRAVENOUS

## 2015-11-29 MED ORDER — LORATADINE 10 MG PO TABS: 10.0000 mg | ORAL_TABLET | Freq: Every day | ORAL | Status: DC

## 2015-11-29 MED ORDER — ACETAMINOPHEN 325 MG PO TABS
ORAL_TABLET | ORAL | Status: AC
  Filled 2015-11-29: qty 2

## 2015-11-30 ENCOUNTER — Ambulatory Visit (INDEPENDENT_AMBULATORY_CARE_PROVIDER_SITE_OTHER): Payer: BC Managed Care – PPO | Admitting: Internal Medicine

## 2015-12-06 ENCOUNTER — Other Ambulatory Visit (INDEPENDENT_AMBULATORY_CARE_PROVIDER_SITE_OTHER): Payer: Self-pay | Admitting: Internal Medicine

## 2015-12-13 ENCOUNTER — Other Ambulatory Visit (INDEPENDENT_AMBULATORY_CARE_PROVIDER_SITE_OTHER): Payer: Self-pay | Admitting: Internal Medicine

## 2015-12-13 ENCOUNTER — Encounter (HOSPITAL_COMMUNITY)
Admission: RE | Admit: 2015-12-13 | Discharge: 2015-12-13 | Disposition: A | Discharge status: Home / Self Care | Payer: BC Managed Care – PPO | Source: Ambulatory Visit | Attending: Internal Medicine | Admitting: Internal Medicine

## 2015-12-13 DIAGNOSIS — K5090 Crohn's disease, unspecified, without complications: Secondary | ICD-10-CM | POA: Diagnosis not present

## 2015-12-13 MED ORDER — ACETAMINOPHEN 325 MG PO TABS
650.0000 mg | ORAL_TABLET | Freq: Once | ORAL | Status: AC
  Administered 2015-12-13: 650 mg via ORAL
  Filled 2015-12-13: qty 2

## 2015-12-13 MED ORDER — SODIUM CHLORIDE 0.9 % IV SOLN
5.0000 mg/kg | INTRAVENOUS | Status: DC
  Administered 2015-12-13: 300 mg via INTRAVENOUS
  Filled 2015-12-13: qty 30

## 2015-12-13 MED ORDER — SODIUM CHLORIDE 0.9 % IV SOLN
INTRAVENOUS | Status: DC
  Administered 2015-12-13: 10:00:00 via INTRAVENOUS

## 2016-01-10 ENCOUNTER — Encounter (HOSPITAL_COMMUNITY)
Admission: RE | Admit: 2016-01-10 | Discharge: 2016-01-10 | Disposition: A | Discharge status: Home / Self Care | Payer: BC Managed Care – PPO | Source: Ambulatory Visit | Attending: Internal Medicine | Admitting: Internal Medicine

## 2016-01-10 ENCOUNTER — Encounter (HOSPITAL_COMMUNITY): Payer: Self-pay

## 2016-01-10 DIAGNOSIS — K50918 Crohn's disease, unspecified, with other complication: Secondary | ICD-10-CM | POA: Diagnosis not present

## 2016-01-10 MED ORDER — SODIUM CHLORIDE 0.9 % IV SOLN
INTRAVENOUS | Status: DC
  Administered 2016-01-10: 250 mL via INTRAVENOUS

## 2016-01-10 MED ORDER — ACETAMINOPHEN 325 MG PO TABS
650.0000 mg | ORAL_TABLET | Freq: Once | ORAL | Status: AC
  Administered 2016-01-10: 650 mg via ORAL
  Filled 2016-01-10: qty 2

## 2016-01-10 MED ORDER — SODIUM CHLORIDE 0.9 % IV SOLN
5.0000 mg/kg | INTRAVENOUS | Status: DC
  Administered 2016-01-10: 300 mg via INTRAVENOUS
  Filled 2016-01-10: qty 30

## 2016-01-10 NOTE — Discharge Instructions (Signed)
Infliximab injection  What is this medicine?  INFLIXIMAB (in FLIX i mab) is used to treat Crohn's disease and ulcerative colitis. It is also used to treat ankylosing spondylitis, psoriasis, and some forms of arthritis.  This medicine may be used for other purposes; ask your health care provider or pharmacist if you have questions.  What should I tell my health care provider before I take this medicine?  They need to know if you have any of these conditions:  -diabetes  -exposure to tuberculosis  -heart failure  -hepatitis or liver disease  -immune system problems  -infection  -lung or breathing disease, like COPD  -multiple sclerosis  -current or past resident of [place]  -seizure disorder  -an unusual or allergic reaction to infliximab, mouse proteins, other medicines, foods, dyes, or preservatives  -pregnant or trying to get pregnant  -breast-feeding  How should I use this medicine?  This medicine is for injection into a vein. It is usually given by a health care professional in a hospital or clinic setting.  A special MedGuide will be given to you by the pharmacist with each prescription and refill. Be sure to read this information carefully each time.  Talk to your pediatrician regarding the use of this medicine in children. Special care may be needed.  Overdosage: If you think you have taken too much of this medicine contact a poison control center or emergency room at once.  NOTE: This medicine is only for you. Do not share this medicine with others.  What if I miss a dose?  It is important not to miss your dose. Call your doctor or health care professional if you are unable to keep an appointment.  What may interact with this medicine?  Do not take this medicine with any of the following medications:  -anakinra  -rilonacept  This medicine may also interact with the following medications:  -vaccines  This list may not describe all possible interactions. Give your health care provider  a list of all the medicines, herbs, non-prescription drugs, or dietary supplements you use. Also tell them if you smoke, drink alcohol, or use illegal drugs. Some items may interact with your medicine.  What should I watch for while using this medicine?  Visit your doctor or health care professional for regular checks on your progress.  If you get a cold or other infection while receiving this medicine, call your doctor or health care professional. Do not treat yourself. This medicine may decrease your body's ability to fight infections. Before beginning therapy, your doctor may do a test to see if you have been exposed to tuberculosis.  This medicine may make the symptoms of heart failure worse in some patients. If you notice symptoms such as increased shortness of breath or swelling of the ankles or legs, contact your health care provider right away.  If you are going to have surgery or dental work, tell your health care professional or dentist that you have received this medicine.  If you take this medicine for plaque psoriasis, stay out of the sun. If you cannot avoid being in the sun, wear protective clothing and use sunscreen. Do not use sun lamps or tanning beds/booths.  What side effects may I notice from receiving this medicine?  Side effects that you should report to your doctor or health care professional as soon as possible:  -allergic reactions like skin rash, itching or hives, swelling of the face, lips, or tongue  -chest pain  -  fever or chills, usually related to the infusion  -muscle or joint pain  -red, scaly patches or raised bumps on the skin  -signs of infection - fever or chills, cough, sore throat, pain or difficulty passing urine  -swollen lymph nodes in the neck, underarm, or groin areas  -unexplained weight loss  -unusual bleeding or bruising  -unusually weak or tired  -yellowing of the eyes or skin  Side effects that usually do not require medical attention (report to your doctor or health  care professional if they continue or are bothersome):  -headache  -heartburn or stomach pain  -nausea, vomiting  This list may not describe all possible side effects. Call your doctor for medical advice about side effects. You may report side effects to FDA at 1-800-FDA-1088.  Where should I keep my medicine?  This drug is given in a hospital or clinic and will not be stored at home.  NOTE: This sheet is a summary. It may not cover all possible information. If you have questions about this medicine, talk to your doctor, pharmacist, or health care provider.     © 2016, [employer]. (2008-02-25 10:26:02)

## 2016-03-06 ENCOUNTER — Encounter (HOSPITAL_COMMUNITY)
Admission: RE | Admit: 2016-03-06 | Discharge: 2016-03-06 | Disposition: A | Discharge status: Home / Self Care | Payer: BC Managed Care – PPO | Source: Ambulatory Visit | Attending: Internal Medicine | Admitting: Internal Medicine

## 2016-03-06 DIAGNOSIS — K5090 Crohn's disease, unspecified, without complications: Secondary | ICD-10-CM | POA: Insufficient documentation

## 2016-03-06 MED ORDER — SODIUM CHLORIDE 0.9 % IV SOLN
5.0000 mg/kg | INTRAVENOUS | Status: DC
  Administered 2016-03-06: 300 mg via INTRAVENOUS
  Filled 2016-03-06: qty 20

## 2016-03-06 MED ORDER — ACETAMINOPHEN 325 MG PO TABS
ORAL_TABLET | ORAL | Status: AC
  Filled 2016-03-06: qty 2

## 2016-03-06 MED ORDER — SODIUM CHLORIDE 0.9 % IV SOLN
Freq: Once | INTRAVENOUS | Status: AC
  Administered 2016-03-06: 09:00:00 via INTRAVENOUS

## 2016-03-06 MED ORDER — LORATADINE 10 MG PO TABS: 10.0000 mg | ORAL_TABLET | Freq: Once | ORAL | Status: DC

## 2016-03-06 MED ORDER — ACETAMINOPHEN 325 MG PO TABS
650.0000 mg | ORAL_TABLET | Freq: Once | ORAL | Status: AC
  Administered 2016-03-06: 650 mg via ORAL

## 2016-05-01 ENCOUNTER — Encounter (HOSPITAL_COMMUNITY): Payer: Self-pay

## 2016-05-01 ENCOUNTER — Encounter (HOSPITAL_COMMUNITY)
Admission: RE | Admit: 2016-05-01 | Discharge: 2016-05-01 | Disposition: A | Discharge status: Home / Self Care | Payer: BC Managed Care – PPO | Source: Ambulatory Visit | Attending: Internal Medicine | Admitting: Internal Medicine

## 2016-05-01 DIAGNOSIS — K5090 Crohn's disease, unspecified, without complications: Secondary | ICD-10-CM | POA: Diagnosis present

## 2016-05-01 MED ORDER — ACETAMINOPHEN 325 MG PO TABS
650.0000 mg | ORAL_TABLET | Freq: Once | ORAL | Status: AC
  Administered 2016-05-01: 650 mg via ORAL

## 2016-05-01 MED ORDER — ACETAMINOPHEN 325 MG PO TABS
ORAL_TABLET | ORAL | Status: AC
  Filled 2016-05-01: qty 2

## 2016-05-01 MED ORDER — SODIUM CHLORIDE 0.9 % IV SOLN
INTRAVENOUS | Status: DC
  Administered 2016-05-01: 09:00:00 via INTRAVENOUS

## 2016-05-01 MED ORDER — SODIUM CHLORIDE 0.9 % IV SOLN
5.0000 mg/kg | INTRAVENOUS | Status: DC
  Administered 2016-05-01: 300 mg via INTRAVENOUS
  Filled 2016-05-01: qty 10

## 2016-05-01 NOTE — Progress Notes (Signed)
Pt states she took her allegra at home before coming to hospital.

## 2016-06-04 ENCOUNTER — Telehealth (INDEPENDENT_AMBULATORY_CARE_PROVIDER_SITE_OTHER): Payer: Self-pay | Admitting: Internal Medicine

## 2016-06-04 NOTE — Telephone Encounter (Signed)
Patient was called and made aware that per Dr.Rehman it is okay to take Flu Vaccination.

## 2016-06-04 NOTE — Telephone Encounter (Signed)
Patient's son Quillian Quince called, stated that Dr. Laural Golden has the patient on a medication therapy at the hospital.  He wants to know if the patient can have a flu shot.  [phone]

## 2016-06-25 ENCOUNTER — Telehealth (INDEPENDENT_AMBULATORY_CARE_PROVIDER_SITE_OTHER): Payer: Self-pay | Admitting: *Deleted

## 2016-06-26 ENCOUNTER — Encounter (HOSPITAL_COMMUNITY): Payer: BC Managed Care – PPO

## 2016-06-27 NOTE — Telephone Encounter (Signed)
A PA is going to have to be updated . The current PA for Remicade will expire 07/22/2016.

## 2016-07-19 ENCOUNTER — Encounter (HOSPITAL_COMMUNITY): Payer: Self-pay

## 2016-07-19 ENCOUNTER — Encounter (HOSPITAL_COMMUNITY)
Admission: RE | Admit: 2016-07-19 | Discharge: 2016-07-19 | Disposition: A | Discharge status: Home / Self Care | Payer: BC Managed Care – PPO | Source: Ambulatory Visit | Attending: Internal Medicine | Admitting: Internal Medicine

## 2016-07-19 DIAGNOSIS — K5090 Crohn's disease, unspecified, without complications: Secondary | ICD-10-CM | POA: Diagnosis not present

## 2016-07-19 MED ORDER — ACETAMINOPHEN 325 MG PO TABS: 650.0000 mg | ORAL_TABLET | Freq: Once | ORAL | Status: DC | PRN

## 2016-07-19 MED ORDER — SODIUM CHLORIDE 0.9 % IV SOLN
5.0000 mg/kg | Freq: Once | INTRAVENOUS | Status: AC
  Administered 2016-07-19: 300 mg via INTRAVENOUS
  Filled 2016-07-19: qty 30

## 2016-07-19 MED ORDER — LORATADINE 10 MG PO TABS: 10.0000 mg | ORAL_TABLET | Freq: Once | ORAL | Status: DC | PRN

## 2016-07-19 MED ORDER — SODIUM CHLORIDE 0.9 % IV SOLN
Freq: Once | INTRAVENOUS | Status: AC
  Administered 2016-07-19: 10:00:00 via INTRAVENOUS

## 2016-08-02 ENCOUNTER — Other Ambulatory Visit (INDEPENDENT_AMBULATORY_CARE_PROVIDER_SITE_OTHER): Payer: Self-pay | Admitting: Internal Medicine

## 2016-09-13 ENCOUNTER — Encounter (HOSPITAL_COMMUNITY)
Admission: RE | Admit: 2016-09-13 | Discharge: 2016-09-13 | Disposition: A | Discharge status: Home / Self Care | Payer: BC Managed Care – PPO | Source: Ambulatory Visit | Attending: Internal Medicine | Admitting: Internal Medicine

## 2016-09-13 DIAGNOSIS — K5090 Crohn's disease, unspecified, without complications: Secondary | ICD-10-CM | POA: Insufficient documentation

## 2016-09-13 MED ORDER — ACETAMINOPHEN 325 MG PO TABS: 650.0000 mg | ORAL_TABLET | Freq: Once | ORAL | Status: DC

## 2016-09-13 MED ORDER — SODIUM CHLORIDE 0.9 % IV SOLN
300.0000 mg | INTRAVENOUS | Status: DC
  Administered 2016-09-13: 300 mg via INTRAVENOUS
  Filled 2016-09-13: qty 30

## 2016-09-13 MED ORDER — SODIUM CHLORIDE 0.9 % IV SOLN
Freq: Once | INTRAVENOUS | Status: AC
  Administered 2016-09-13: 250 mL via INTRAVENOUS

## 2016-09-13 MED ORDER — LORATADINE 10 MG PO TABS: 10.0000 mg | ORAL_TABLET | Freq: Once | ORAL | Status: DC

## 2016-09-25 ENCOUNTER — Telehealth (INDEPENDENT_AMBULATORY_CARE_PROVIDER_SITE_OTHER): Payer: Self-pay | Admitting: Internal Medicine

## 2016-09-25 NOTE — Telephone Encounter (Signed)
Patient called, stated that she received a letter from her insurance stating that they will no longer cover her Remicade.    [phone]

## 2016-09-26 NOTE — Telephone Encounter (Signed)
A Pa was completed for this patient, approval on patient's chart. Patient was called and a message was left.

## 2016-10-23 ENCOUNTER — Other Ambulatory Visit: Payer: Self-pay | Admitting: Internal Medicine

## 2016-10-23 DIAGNOSIS — M6281 Muscle weakness (generalized): Secondary | ICD-10-CM

## 2016-11-02 ENCOUNTER — Telehealth (INDEPENDENT_AMBULATORY_CARE_PROVIDER_SITE_OTHER): Payer: Self-pay | Admitting: *Deleted

## 2016-11-02 NOTE — Telephone Encounter (Signed)
Records faxed.

## 2016-11-02 NOTE — Telephone Encounter (Signed)
Due to patient's insurance, she will be getting her Remicade Infusions at [facility]. Kristen Cooper from [facility] has called. They are needing her office notes and lab work form [place] , for the continuation of care.  Fax number is [phone]

## 2016-11-05 ENCOUNTER — Ambulatory Visit
Admission: RE | Admit: 2016-11-05 | Discharge: 2016-11-05 | Disposition: A | Discharge status: Home / Self Care | Payer: BC Managed Care – PPO | Source: Ambulatory Visit | Attending: Internal Medicine | Admitting: Internal Medicine

## 2016-11-05 DIAGNOSIS — M6281 Muscle weakness (generalized): Secondary | ICD-10-CM

## 2016-11-08 ENCOUNTER — Encounter (HOSPITAL_COMMUNITY): Payer: BC Managed Care – PPO

## 2016-11-26 ENCOUNTER — Ambulatory Visit: Payer: Self-pay | Admitting: General Surgery

## 2016-11-26 NOTE — H&P (Signed)
History of Present Illness Ralene Ok MD; 11/26/2016 1:35 PM) The patient is a 51 year old female who presents with a complaint of Muscle weakness. The patient is a 51 year old female who is referred by Dr Wendy Poet for evaluation of muscle weakness/muscle biopsy. Patient comes in today with a several month history of muscle weakness, and stiffness. Patient states that she has Crohn's disease and currently is taking Medicaid. She also appears to have arthritis which is recently diagnosed and being treated.     Past Surgical History Malachy Moan, [place]; 11/26/2016 1:28 PM) Appendectomy  Colon Polyp Removal - Colonoscopy   Diagnostic Studies History Malachy Moan, RMA; 11/26/2016 1:28 PM) Colonoscopy  1-5 years ago Mammogram  within last year  Allergies Malachy Moan, RMA; 11/26/2016 1:29 PM) No Known Allergies 11/26/2016  Medication History Malachy Moan, RMA; 11/26/2016 1:30 PM) Pentasa (500MG Capsule ER, Oral) Active. Lisinopril (10MG Tablet, Oral) Active. Tylenol Extended Release (650MG Tablet ER, Oral) Active. ZyrTEC (10MG Tablet, Oral) Active. Allegra Allergy (180MG Tablet, Oral) Active. Remicade (100MG For Solution, Intravenous) Active. TraMADol HCl (50MG Tablet, Oral) Active. Medications Reconciled  Social History Malachy Moan, [place]; 11/26/2016 1:28 PM) Alcohol use  Occasional alcohol use. Caffeine use  Tea. No drug use  Tobacco use  Never smoker.  Family History Malachy Moan, [place]; 11/26/2016 1:28 PM) Breast Cancer  Mother. Hypertension  Brother, Father, Mother. Ischemic Bowel Disease  Father, Mother.  Pregnancy / Birth History Malachy Moan, [place]; 11/26/2016 1:29 PM) Age at menarche  50 years. Contraceptive History  Oral contraceptives. Gravida  0 Irregular periods  Para  0  Other Problems Malachy Moan, RMA; 11/26/2016 1:28 PM) Arthritis  Back Pain  Crohn's Disease  High blood pressure     Review of  Systems Malachy Moan RMA; 11/26/2016 1:29 PM) General Present- Appetite Loss, Fatigue and Night Sweats. Not Present- Chills, Fever, Weight Gain and Weight Loss. Skin Not Present- Change in Wart/Mole, Dryness, Hives, Jaundice, New Lesions, Non-Healing Wounds, Rash and Ulcer. HEENT Present- Oral Ulcers, Seasonal Allergies and Wears glasses/contact lenses. Not Present- Earache, Hearing Loss, Hoarseness, Nose Bleed, Ringing in the Ears, Sinus Pain, Sore Throat, Visual Disturbances and Yellow Eyes. Respiratory Not Present- Bloody sputum, Chronic Cough, Difficulty Breathing, Snoring and Wheezing. Breast Not Present- Breast Mass, Breast Pain, Nipple Discharge and Skin Changes. Cardiovascular Not Present- Chest Pain, Difficulty Breathing Lying Down, Leg Cramps, Palpitations, Rapid Heart Rate, Shortness of Breath and Swelling of Extremities. Gastrointestinal Present- Nausea. Not Present- Abdominal Pain, Bloating, Bloody Stool, Change in Bowel Habits, Chronic diarrhea, Constipation, Difficulty Swallowing, Excessive gas, Gets full quickly at meals, Hemorrhoids, Indigestion, Rectal Pain and Vomiting. Female Genitourinary Not Present- Frequency, Nocturia, Painful Urination, Pelvic Pain and Urgency. Musculoskeletal Present- Joint Pain, Joint Stiffness, Muscle Weakness and Swelling of Extremities. Not Present- Back Pain and Muscle Pain. Neurological Not Present- Decreased Memory, Fainting, Headaches, Numbness, Seizures, Tingling, Tremor, Trouble walking and Weakness. Psychiatric Not Present- Anxiety, Bipolar, Change in Sleep Pattern, Depression, Fearful and Frequent crying. Endocrine Present- Hot flashes. Not Present- Cold Intolerance, Excessive Hunger, Hair Changes, Heat Intolerance and New Diabetes. Hematology Not Present- Blood Thinners, Easy Bruising, Excessive bleeding, Gland problems, HIV and Persistent Infections.  Vitals Malachy Moan RMA; 11/26/2016 1:31 PM) 11/26/2016 1:30 PM Weight: 112.8 lb  Height: 63in Body Surface Area: 1.52 m Body Mass Index: 19.98 kg/m  Temp.: 98.33F  Pulse: 118 (Regular)  BP: 120/70 (Sitting, Left Arm, Standard)       Physical Exam Ralene Ok, MD; 11/26/2016 1:35 PM) General Mental Status-Alert. General  Appearance-Consistent with stated age. Hydration-Well hydrated. Voice-Normal.  Chest and Lung Exam Chest and lung exam reveals -quiet, even and easy respiratory effort with no use of accessory muscles and on auscultation, normal breath sounds, no adventitious sounds and normal vocal resonance. Inspection Chest Wall - Normal. Back - normal.  Cardiovascular Cardiovascular examination reveals -normal heart sounds, regular rate and rhythm with no murmurs and normal pedal pulses bilaterally.  Abdomen Inspection Normal Exam - No Hernias. Skin - Scar - no surgical scars. Palpation/Percussion Normal exam - Soft, Non Tender, No Rebound tenderness, No Rigidity (guarding) and No hepatosplenomegaly. Auscultation Normal exam - Bowel sounds normal.  Neurologic Neurologic evaluation reveals -alert and oriented x 3 with no impairment of recent or remote memory. Mental Status-Normal.  Musculoskeletal Normal Exam - Left-Upper Extremity Strength Normal and Lower Extremity Strength Normal. Normal Exam - Right-Upper Extremity Strength Normal and Lower Extremity Strength Normal.    Assessment & Plan Ralene Ok MD; 11/26/2016 1:35 PM) MUSCLE WEAKNESS (M62.81) Impression: 51 year old female with muscle weakness 1. Will proceed to the operating room for a right lower extremely muscle biopsy 2. Discussion of the risks and benefits of the procedure to include but not limited to: Infection, bleeding, damage to structures, possible wound healing issues. Patient voiced understanding and wished to proceed.

## 2016-12-28 NOTE — Patient Instructions (Addendum)
Kristen Cooper  12/28/2016   Your procedure is scheduled on: 01-02-17   Report to [place]  elevators to 3rd floor to [place] at 8:15 AM.   Call this number if you have problems the morning of surgery [phone]    Remember: ONLY 1 PERSON MAY GO WITH YOU TO SHORT STAY TO GET  READY MORNING OF [place].  Do not eat food or drink liquids :After Midnight.     Take these medicines the morning of surgery with A SIP OF WATER: None                                You may not have any metal on your body including hair pins and              piercings  Do not wear jewelry, make-up, lotions, powders or perfumes, deodorant             Do not wear nail polish.  Do not shave  48 hours prior to surgery.     Do not bring valuables to the hospital. [place].  Contacts, dentures or bridgework may not be worn into surgery.      Patients discharged the day of surgery will not be allowed to drive home.  Name and phone number of your driver: Kristen Cooper [phone]                Please read over the following fact sheets you were given: _____________________________________________________________________             [facility] - Preparing for Surgery Before surgery, you can play an important role.  Because skin is not sterile, your skin needs to be as free of germs as possible.  You can reduce the number of germs on your skin by washing with CHG (chlorahexidine gluconate) soap before surgery.  CHG is an antiseptic cleaner which kills germs and bonds with the skin to continue killing germs even after washing. Please DO NOT use if you have an allergy to CHG or antibacterial soaps.  If your skin becomes reddened/irritated stop using the CHG and inform your nurse when you arrive at Short Stay. Do not shave (including legs and underarms) for at least 48 hours prior to the first CHG shower.  You  may shave your face/neck. Please follow these instructions carefully:  1.  Shower with CHG Soap the night before surgery and the  morning of Surgery.  2.  If you choose to wash your hair, wash your hair first as usual with your  normal  shampoo.  3.  After you shampoo, rinse your hair and body thoroughly to remove the  shampoo.                           4.  Use CHG as you would any other liquid soap.  You can apply chg directly  to the skin and wash                       Gently with a scrungie or clean washcloth.  5.  Apply the CHG Soap to your body ONLY FROM  THE NECK DOWN.   Do not use on face/ open                           Wound or open sores. Avoid contact with eyes, ears mouth and genitals (private parts).                       Wash face,  Genitals (private parts) with your normal soap.             6.  Wash thoroughly, paying special attention to the area where your surgery  will be performed.  7.  Thoroughly rinse your body with warm water from the neck down.  8.  DO NOT shower/wash with your normal soap after using and rinsing off  the CHG Soap.                9.  Pat yourself dry with a clean towel.            10.  Wear clean pajamas.            11.  Place clean sheets on your bed the night of your first shower and do not  sleep with pets. Day of Surgery : Do not apply any lotions/deodorants the morning of surgery.  Please wear clean clothes to the hospital/surgery center.  FAILURE TO FOLLOW THESE INSTRUCTIONS MAY RESULT IN THE CANCELLATION OF YOUR SURGERY PATIENT SIGNATURE_________________________________  NURSE SIGNATURE__________________________________  ________________________________________________________________________

## 2016-12-31 ENCOUNTER — Encounter (HOSPITAL_COMMUNITY): Payer: Self-pay | Admitting: *Deleted

## 2016-12-31 ENCOUNTER — Encounter (HOSPITAL_COMMUNITY)
Admission: RE | Admit: 2016-12-31 | Discharge: 2016-12-31 | Disposition: A | Discharge status: Home / Self Care | Payer: BC Managed Care – PPO | Source: Ambulatory Visit | Attending: General Surgery | Admitting: General Surgery

## 2016-12-31 DIAGNOSIS — Z01810 Encounter for preprocedural cardiovascular examination: Secondary | ICD-10-CM | POA: Diagnosis not present

## 2016-12-31 DIAGNOSIS — G7249 Other inflammatory and immune myopathies, not elsewhere classified: Secondary | ICD-10-CM | POA: Diagnosis not present

## 2016-12-31 DIAGNOSIS — Z8249 Family history of ischemic heart disease and other diseases of the circulatory system: Secondary | ICD-10-CM | POA: Diagnosis not present

## 2016-12-31 DIAGNOSIS — Z01812 Encounter for preprocedural laboratory examination: Secondary | ICD-10-CM | POA: Diagnosis not present

## 2016-12-31 DIAGNOSIS — Z79899 Other long term (current) drug therapy: Secondary | ICD-10-CM | POA: Diagnosis not present

## 2016-12-31 DIAGNOSIS — Z8379 Family history of other diseases of the digestive system: Secondary | ICD-10-CM | POA: Diagnosis not present

## 2016-12-31 DIAGNOSIS — Z803 Family history of malignant neoplasm of breast: Secondary | ICD-10-CM | POA: Diagnosis not present

## 2016-12-31 DIAGNOSIS — M6281 Muscle weakness (generalized): Secondary | ICD-10-CM | POA: Diagnosis not present

## 2016-12-31 DIAGNOSIS — K5090 Crohn's disease, unspecified, without complications: Secondary | ICD-10-CM | POA: Diagnosis not present

## 2016-12-31 DIAGNOSIS — M1990 Unspecified osteoarthritis, unspecified site: Secondary | ICD-10-CM | POA: Diagnosis not present

## 2016-12-31 DIAGNOSIS — I10 Essential (primary) hypertension: Secondary | ICD-10-CM | POA: Diagnosis not present

## 2016-12-31 LAB — BASIC METABOLIC PANEL
Anion gap: 10 (ref 5–15)
BUN: 24 mg/dL — ABNORMAL HIGH (ref 6–20)
CALCIUM: 9.2 mg/dL (ref 8.9–10.3)
CHLORIDE: 102 mmol/L (ref 101–111)
CO2: 27 mmol/L (ref 22–32)
CREATININE: 0.73 mg/dL (ref 0.44–1.00)
GFR calc non Af Amer: >60 mL/min (ref >60)
Glucose, Bld: 86 mg/dL (ref 65–99)
Potassium: 3.9 mmol/L (ref 3.5–5.1)
SODIUM: 139 mmol/L (ref 135–145)

## 2016-12-31 LAB — CBC
HCT: 40.2 % (ref 36.0–46.0)
Hemoglobin: 13.6 g/dL (ref 12.0–15.0)
MCH: 30.3 pg (ref 26.0–34.0)
MCHC: 33.8 g/dL (ref 30.0–36.0)
MCV: 89.5 fL (ref 78.0–100.0)
PLATELETS: 347 10*3/uL (ref 150–400)
RBC: 4.49 MIL/uL (ref 3.87–5.11)
RDW: 12.6 % (ref 11.5–15.5)
WBC: 6.7 10*3/uL (ref 4.0–10.5)

## 2016-12-31 NOTE — Progress Notes (Signed)
12-31-16 BMP result, routed to Dr. Rosendo Gros for review

## 2017-01-01 ENCOUNTER — Encounter (HOSPITAL_BASED_OUTPATIENT_CLINIC_OR_DEPARTMENT_OTHER): Payer: Self-pay | Admitting: *Deleted

## 2017-01-01 NOTE — Progress Notes (Signed)
SPOKE W/ PT TODAY VIA PHONE.  PT'S CASE WAS MOVED FROM [facility] MAIN OR TO [place].  NPO AFTER MN.  ARRIVE AT 0830.  CURRENT LAB RESULTS IN CHART AND EPIC.  PT STATED GIVEN INSTRUCTIONS FOR HIBICLENS SHOWER HS BEFORE AND AM DOS.

## 2017-01-02 ENCOUNTER — Encounter (HOSPITAL_BASED_OUTPATIENT_CLINIC_OR_DEPARTMENT_OTHER): Payer: Self-pay | Admitting: *Deleted

## 2017-01-02 ENCOUNTER — Ambulatory Visit (HOSPITAL_COMMUNITY)
Admission: RE | Admit: 2017-01-02 | Discharge: 2017-01-02 | Disposition: A | Discharge status: Home / Self Care | Payer: BC Managed Care – PPO | Source: Ambulatory Visit | Attending: General Surgery | Admitting: General Surgery

## 2017-01-02 ENCOUNTER — Ambulatory Visit (HOSPITAL_BASED_OUTPATIENT_CLINIC_OR_DEPARTMENT_OTHER): Payer: BC Managed Care – PPO | Admitting: Anesthesiology

## 2017-01-02 ENCOUNTER — Encounter (HOSPITAL_BASED_OUTPATIENT_CLINIC_OR_DEPARTMENT_OTHER)
Admission: RE | Disposition: A | Discharge status: Home / Self Care | Payer: Self-pay | Source: Ambulatory Visit | Attending: General Surgery

## 2017-01-02 DIAGNOSIS — G7249 Other inflammatory and immune myopathies, not elsewhere classified: Secondary | ICD-10-CM | POA: Diagnosis not present

## 2017-01-02 DIAGNOSIS — Z01812 Encounter for preprocedural laboratory examination: Secondary | ICD-10-CM | POA: Insufficient documentation

## 2017-01-02 DIAGNOSIS — K5090 Crohn's disease, unspecified, without complications: Secondary | ICD-10-CM | POA: Insufficient documentation

## 2017-01-02 DIAGNOSIS — M1990 Unspecified osteoarthritis, unspecified site: Secondary | ICD-10-CM | POA: Insufficient documentation

## 2017-01-02 DIAGNOSIS — Z803 Family history of malignant neoplasm of breast: Secondary | ICD-10-CM | POA: Insufficient documentation

## 2017-01-02 DIAGNOSIS — I10 Essential (primary) hypertension: Secondary | ICD-10-CM | POA: Insufficient documentation

## 2017-01-02 DIAGNOSIS — Z8379 Family history of other diseases of the digestive system: Secondary | ICD-10-CM | POA: Insufficient documentation

## 2017-01-02 DIAGNOSIS — Z8249 Family history of ischemic heart disease and other diseases of the circulatory system: Secondary | ICD-10-CM | POA: Insufficient documentation

## 2017-01-02 DIAGNOSIS — Z79899 Other long term (current) drug therapy: Secondary | ICD-10-CM | POA: Insufficient documentation

## 2017-01-02 DIAGNOSIS — Z01810 Encounter for preprocedural cardiovascular examination: Secondary | ICD-10-CM | POA: Insufficient documentation

## 2017-01-02 HISTORY — PX: MUSCLE BIOPSY: SHX716

## 2017-01-02 SURGERY — MUSCLE BIOPSY
Anesthesia: General | Site: Thigh | Laterality: Right

## 2017-01-02 MED ORDER — MORPHINE SULFATE (PF) 2 MG/ML IV SOLN
2.0000 mg | INTRAVENOUS | Status: DC | PRN
  Filled 2017-01-02: qty 1

## 2017-01-02 MED ORDER — HYDROMORPHONE HCL 1 MG/ML IJ SOLN
INTRAMUSCULAR | Status: AC
  Filled 2017-01-02: qty 0.5

## 2017-01-02 MED ORDER — OXYCODONE-ACETAMINOPHEN 5-325 MG PO TABS
1.0000 | ORAL_TABLET | Freq: Four times a day (QID) | ORAL | 0 refills | Status: DC | PRN
Start: 2017-01-02 — End: 2017-11-25

## 2017-01-02 MED ORDER — CHLORHEXIDINE GLUCONATE CLOTH 2 % EX PADS
6.0000 | MEDICATED_PAD | Freq: Once | CUTANEOUS | Status: DC
  Filled 2017-01-02: qty 6

## 2017-01-02 MED ORDER — ONDANSETRON HCL 4 MG/2ML IJ SOLN
INTRAMUSCULAR | Status: DC | PRN
  Administered 2017-01-02: 4 mg via INTRAVENOUS

## 2017-01-02 MED ORDER — OXYCODONE HCL 5 MG PO TABS
5.0000 mg | ORAL_TABLET | ORAL | Status: DC | PRN
  Administered 2017-01-02: 5 mg via ORAL
  Filled 2017-01-02: qty 2

## 2017-01-02 MED ORDER — LIDOCAINE 2% (20 MG/ML) 5 ML SYRINGE
INTRAMUSCULAR | Status: AC
  Filled 2017-01-02: qty 5

## 2017-01-02 MED ORDER — CEFAZOLIN SODIUM-DEXTROSE 2-4 GM/100ML-% IV SOLN
INTRAVENOUS | Status: AC
  Filled 2017-01-02: qty 100

## 2017-01-02 MED ORDER — LIDOCAINE 2% (20 MG/ML) 5 ML SYRINGE
INTRAMUSCULAR | Status: DC | PRN
  Administered 2017-01-02: 100 mg via INTRAVENOUS

## 2017-01-02 MED ORDER — BUPIVACAINE-EPINEPHRINE 0.25% -1:200000 IJ SOLN
INTRAMUSCULAR | Status: DC | PRN
  Administered 2017-01-02: 10 mL

## 2017-01-02 MED ORDER — ACETAMINOPHEN 325 MG PO TABS
650.0000 mg | ORAL_TABLET | ORAL | Status: DC | PRN
  Filled 2017-01-02: qty 2

## 2017-01-02 MED ORDER — MIDAZOLAM HCL 5 MG/5ML IJ SOLN
INTRAMUSCULAR | Status: DC | PRN
  Administered 2017-01-02: 1 mg via INTRAVENOUS

## 2017-01-02 MED ORDER — LACTATED RINGERS IV SOLN
INTRAVENOUS | Status: DC
  Administered 2017-01-02 (×2): via INTRAVENOUS
  Filled 2017-01-02: qty 1000

## 2017-01-02 MED ORDER — ONDANSETRON HCL 4 MG/2ML IJ SOLN
INTRAMUSCULAR | Status: AC
  Filled 2017-01-02: qty 2

## 2017-01-02 MED ORDER — HYDROMORPHONE HCL 1 MG/ML IJ SOLN
0.2500 mg | INTRAMUSCULAR | Status: DC | PRN
  Administered 2017-01-02 (×2): 0.5 mg via INTRAVENOUS
  Filled 2017-01-02: qty 0.5

## 2017-01-02 MED ORDER — ONDANSETRON HCL 4 MG/2ML IJ SOLN
INTRAMUSCULAR | Status: AC
  Filled 2017-01-02: qty 10

## 2017-01-02 MED ORDER — MEPERIDINE HCL 25 MG/ML IJ SOLN
6.2500 mg | INTRAMUSCULAR | Status: DC | PRN
  Filled 2017-01-02: qty 1

## 2017-01-02 MED ORDER — SODIUM CHLORIDE 0.9 % IV SOLN
250.0000 mL | INTRAVENOUS | Status: DC | PRN
  Filled 2017-01-02: qty 250

## 2017-01-02 MED ORDER — FENTANYL CITRATE (PF) 100 MCG/2ML IJ SOLN
INTRAMUSCULAR | Status: AC
  Filled 2017-01-02: qty 2

## 2017-01-02 MED ORDER — SODIUM CHLORIDE 0.9% FLUSH
3.0000 mL | Freq: Two times a day (BID) | INTRAVENOUS | Status: DC
  Filled 2017-01-02: qty 3

## 2017-01-02 MED ORDER — PROPOFOL 10 MG/ML IV BOLUS
INTRAVENOUS | Status: AC
  Filled 2017-01-02: qty 40

## 2017-01-02 MED ORDER — MIDAZOLAM HCL 2 MG/2ML IJ SOLN
INTRAMUSCULAR | Status: AC
Start: 2017-01-02 — End: ?
  Filled 2017-01-02: qty 2

## 2017-01-02 MED ORDER — ACETAMINOPHEN 650 MG RE SUPP
650.0000 mg | RECTAL | Status: DC | PRN
  Filled 2017-01-02: qty 1

## 2017-01-02 MED ORDER — SODIUM CHLORIDE 0.9% FLUSH
3.0000 mL | INTRAVENOUS | Status: DC | PRN
  Filled 2017-01-02: qty 3

## 2017-01-02 MED ORDER — FENTANYL CITRATE (PF) 100 MCG/2ML IJ SOLN
INTRAMUSCULAR | Status: DC | PRN
  Administered 2017-01-02: 50 ug via INTRAVENOUS

## 2017-01-02 MED ORDER — DEXAMETHASONE SODIUM PHOSPHATE 10 MG/ML IJ SOLN
INTRAMUSCULAR | Status: AC
  Filled 2017-01-02: qty 1

## 2017-01-02 MED ORDER — PROPOFOL 10 MG/ML IV BOLUS
INTRAVENOUS | Status: DC | PRN
  Administered 2017-01-02: 150 mg via INTRAVENOUS

## 2017-01-02 MED ORDER — CEFAZOLIN SODIUM-DEXTROSE 2-4 GM/100ML-% IV SOLN
2.0000 g | INTRAVENOUS | Status: AC
  Administered 2017-01-02: 2 g via INTRAVENOUS
  Filled 2017-01-02: qty 100

## 2017-01-02 MED ORDER — ONDANSETRON HCL 4 MG/2ML IJ SOLN
4.0000 mg | Freq: Once | INTRAMUSCULAR | Status: DC | PRN
  Filled 2017-01-02: qty 2

## 2017-01-02 MED ORDER — OXYCODONE HCL 5 MG PO TABS
ORAL_TABLET | ORAL | Status: AC
  Filled 2017-01-02: qty 1

## 2017-01-02 MED ORDER — DEXAMETHASONE SODIUM PHOSPHATE 4 MG/ML IJ SOLN
INTRAMUSCULAR | Status: DC | PRN
  Administered 2017-01-02: 10 mg via INTRAVENOUS

## 2017-01-02 SURGICAL SUPPLY — 51 items
ADH SKN CLS APL DERMABOND .7 (GAUZE/BANDAGES/DRESSINGS) ×1
APL SKNCLS STERI-STRIP NONHPOA (GAUZE/BANDAGES/DRESSINGS) ×1
BENZOIN TINCTURE PRP APPL 2/3 (GAUZE/BANDAGES/DRESSINGS) ×3 IMPLANT
BLADE HEX COATED 2.75 (ELECTRODE) ×3 IMPLANT
BLADE SURG 15 STRL LF DISP TIS (BLADE) ×2 IMPLANT
BLADE SURG 15 STRL SS (BLADE) ×6
CLOSURE WOUND 1/2 X4 (GAUZE/BANDAGES/DRESSINGS) ×1
COVER BACK TABLE 60X90IN (DRAPES) ×3 IMPLANT
COVER MAYO STAND STRL (DRAPES) ×3 IMPLANT
DECANTER SPIKE VIAL GLASS SM (MISCELLANEOUS) ×3 IMPLANT
DERMABOND ADVANCED (GAUZE/BANDAGES/DRESSINGS) ×2
DERMABOND ADVANCED .7 DNX12 (GAUZE/BANDAGES/DRESSINGS) IMPLANT
DRAPE LAPAROTOMY TRNSV 102X78 (DRAPE) ×3 IMPLANT
DRAPE U-SHAPE 47X51 STRL (DRAPES) ×4 IMPLANT
DRAPE UTILITY 15X26 (DRAPE) ×2 IMPLANT
DRAPE UTILITY XL STRL (DRAPES) ×3 IMPLANT
DRSG TEGADERM 4X4.75 (GAUZE/BANDAGES/DRESSINGS) IMPLANT
ELECT REM PT RETURN 15FT ADLT (MISCELLANEOUS) ×3 IMPLANT
GAUZE SPONGE 4X4 12PLY STRL (GAUZE/BANDAGES/DRESSINGS) ×3 IMPLANT
GAUZE SPONGE 4X4 12PLY STRL LF (GAUZE/BANDAGES/DRESSINGS) ×3 IMPLANT
GAUZE SPONGE 4X4 16PLY XRAY LF (GAUZE/BANDAGES/DRESSINGS) ×3 IMPLANT
GLOVE BIO SURGEON STRL SZ7.5 (GLOVE) ×3 IMPLANT
GLOVE BIOGEL PI IND STRL 7.0 (GLOVE) IMPLANT
GLOVE BIOGEL PI IND STRL 7.5 (GLOVE) IMPLANT
GLOVE BIOGEL PI IND STRL 8 (GLOVE) ×1 IMPLANT
GLOVE BIOGEL PI INDICATOR 7.0 (GLOVE) ×2
GLOVE BIOGEL PI INDICATOR 7.5 (GLOVE) ×2
GLOVE BIOGEL PI INDICATOR 8 (GLOVE) ×2
GLOVE ECLIPSE 7.0 STRL STRAW (GLOVE) ×2 IMPLANT
GOWN STRL REUS W/ TWL XL LVL3 (GOWN DISPOSABLE) ×1 IMPLANT
GOWN STRL REUS W/TWL LRG LVL3 (GOWN DISPOSABLE) ×3 IMPLANT
GOWN STRL REUS W/TWL XL LVL3 (GOWN DISPOSABLE) ×10 IMPLANT
MARKER SKIN DUAL TIP RULER LAB (MISCELLANEOUS) ×3 IMPLANT
NDL HYPO 25X1 1.5 SAFETY (NEEDLE) ×1 IMPLANT
NEEDLE HYPO 22GX1.5 SAFETY (NEEDLE) IMPLANT
NEEDLE HYPO 25X1 1.5 SAFETY (NEEDLE) ×3 IMPLANT
NS IRRIG 500ML POUR BTL (IV SOLUTION) ×3 IMPLANT
PACK BASIN DAY SURGERY FS (CUSTOM PROCEDURE TRAY) ×3 IMPLANT
PENCIL BUTTON HOLSTER BLD 10FT (ELECTRODE) ×3 IMPLANT
STRIP CLOSURE SKIN 1/2X4 (GAUZE/BANDAGES/DRESSINGS) ×2 IMPLANT
SUT MNCRL AB 4-0 PS2 18 (SUTURE) IMPLANT
SUT SILK 2 0 (SUTURE) ×3
SUT SILK 2-0 18XBRD TIE 12 (SUTURE) IMPLANT
SUT VIC AB 3-0 SH 27 (SUTURE) ×3
SUT VIC AB 3-0 SH 27X BRD (SUTURE) IMPLANT
SUT VIC AB 4-0 SH 18 (SUTURE) ×3 IMPLANT
SYR CONTROL 10ML LL (SYRINGE) ×3 IMPLANT
TOWEL OR 17X24 6PK STRL BLUE (TOWEL DISPOSABLE) ×3 IMPLANT
TUBE CONNECTING 12'X1/4 (SUCTIONS)
TUBE CONNECTING 12X1/4 (SUCTIONS) IMPLANT
YANKAUER SUCT BULB TIP 10FT TU (MISCELLANEOUS) IMPLANT

## 2017-01-02 NOTE — H&P (Signed)
History of Present Illness  The patient is a 51 year old female who presents with a complaint of Muscle weakness. The patient is a 51 year old female who is referred by Dr Wendy Poet for evaluation of muscle weakness/muscle biopsy. Patient comes in today with a several month history of muscle weakness, and stiffness. Patient states that she has Crohn's disease and currently is taking Medicaid. She also appears to have arthritis which is recently diagnosed and being treated.     Past Surgical History Appendectomy  Colon Polyp Removal - Colonoscopy   Diagnostic Studies History  Colonoscopy  1-5 years ago Mammogram  within last year  Allergies No Known Allergies 11/26/2016  Medication History  Pentasa (500MG Capsule ER, Oral) Active. Lisinopril (10MG Tablet, Oral) Active. Tylenol Extended Release (650MG Tablet ER, Oral) Active. ZyrTEC (10MG Tablet, Oral) Active. Allegra Allergy (180MG Tablet, Oral) Active. Remicade (100MG For Solution, Intravenous) Active. TraMADol HCl (50MG Tablet, Oral) Active. Medications Reconciled  Social History Alcohol use  Occasional alcohol use. Caffeine use  Tea. No drug use  Tobacco use  Never smoker.  Family History Breast Cancer  Mother. Hypertension  Brother, Father, Mother. Ischemic Bowel Disease  Father, Mother.  Pregnancy / Birth History  Age at menarche  14 years. Contraceptive History  Oral contraceptives. Gravida  0 Irregular periods  Para  0  Other Problems  Arthritis  Back Pain  Crohn's Disease  High blood pressure     Review of Systems  General Present- Appetite Loss, Fatigue and Night Sweats. Not Present- Chills, Fever, Weight Gain and Weight Loss. Skin Not Present- Change in Wart/Mole, Dryness, Hives, Jaundice, New Lesions, Non-Healing Wounds, Rash and Ulcer. HEENT Present- Oral Ulcers, Seasonal Allergies and Wears glasses/contact lenses. Not Present- Earache, Hearing Loss,  Hoarseness, Nose Bleed, Ringing in the Ears, Sinus Pain, Sore Throat, Visual Disturbances and Yellow Eyes. Respiratory Not Present- Bloody sputum, Chronic Cough, Difficulty Breathing, Snoring and Wheezing. Breast Not Present- Breast Mass, Breast Pain, Nipple Discharge and Skin Changes. Cardiovascular Not Present- Chest Pain, Difficulty Breathing Lying Down, Leg Cramps, Palpitations, Rapid Heart Rate, Shortness of Breath and Swelling of Extremities. Gastrointestinal Present- Nausea. Not Present- Abdominal Pain, Bloating, Bloody Stool, Change in Bowel Habits, Chronic diarrhea, Constipation, Difficulty Swallowing, Excessive gas, Gets full quickly at meals, Hemorrhoids, Indigestion, Rectal Pain and Vomiting. Female Genitourinary Not Present- Frequency, Nocturia, Painful Urination, Pelvic Pain and Urgency. Musculoskeletal Present- Joint Pain, Joint Stiffness, Muscle Weakness and Swelling of Extremities. Not Present- Back Pain and Muscle Pain. Neurological Not Present- Decreased Memory, Fainting, Headaches, Numbness, Seizures, Tingling, Tremor, Trouble walking and Weakness. Psychiatric Not Present- Anxiety, Bipolar, Change in Sleep Pattern, Depression, Fearful and Frequent crying. Endocrine Present- Hot flashes. Not Present- Cold Intolerance, Excessive Hunger, Hair Changes, Heat Intolerance and New Diabetes. Hematology Not Present- Blood Thinners, Easy Bruising, Excessive bleeding, Gland problems, HIV and Persistent Infections.  BP (!) 115/58   Pulse 94   Temp 98 F (36.7 C) (Oral)   Resp 16   Ht 5' 2"  (1.575 m)   Wt 52.2 kg (115 lb)   LMP 06/03/2016 (Approximate)   SpO2 100%   BMI 21.03 kg/m    Physical Exam  General Mental Status-Alert. General Appearance-Consistent with stated age. Hydration-Well hydrated. Voice-Normal.  Chest and Lung Exam Chest and lung exam reveals -quiet, even and easy respiratory effort with no use of accessory muscles and on auscultation, normal breath  sounds, no adventitious sounds and normal vocal resonance. Inspection Chest Wall - Normal. Back - normal.  Cardiovascular Cardiovascular examination  reveals -normal heart sounds, regular rate and rhythm with no murmurs and normal pedal pulses bilaterally.  Abdomen Inspection Normal Exam - No Hernias. Skin - Scar - no surgical scars. Palpation/Percussion Normal exam - Soft, Non Tender, No Rebound tenderness, No Rigidity (guarding) and No hepatosplenomegaly. Auscultation Normal exam - Bowel sounds normal.  Neurologic Neurologic evaluation reveals -alert and oriented x 3 with no impairment of recent or remote memory. Mental Status-Normal.  Musculoskeletal Normal Exam - Left-Upper Extremity Strength Normal and Lower Extremity Strength Normal. Normal Exam - Right-Upper Extremity Strength Normal and Lower Extremity Strength Normal.    Assessment & Plan  MUSCLE WEAKNESS (M62.81) Impression: 51 year old female with muscle weakness 1. Will proceed to the operating room for a right lower extremely muscle biopsy 2. Discussion of the risks and benefits of the procedure to include but not limited to: Infection, bleeding, damage to structures, possible wound healing issues. Patient voiced understanding and wished to proceed.

## 2017-01-02 NOTE — Anesthesia Postprocedure Evaluation (Signed)
Anesthesia Post Note  Patient: Kristen Cooper  Procedure(s) Performed: Procedure(s) (LRB): UPPER RIGHT  EXTREMITY MUSCLE BIOPSY (Right)     Patient location during evaluation: PACU Anesthesia Type: General Level of consciousness: awake Pain management: pain level controlled Vital Signs Assessment: post-procedure vital signs reviewed and stable Respiratory status: patient connected to nasal cannula oxygen Cardiovascular status: stable Postop Assessment: no signs of nausea or vomiting Anesthetic complications: no    Last Vitals:  Vitals:   01/02/17 1130 01/02/17 1136  BP: (!) 112/50   Pulse: 71 75  Resp: 11 15  Temp:      Last Pain:  Vitals:   01/02/17 1136  TempSrc:   PainSc: 8    Pain Goal: Patients Stated Pain Goal: 7 (01/02/17 0846)               Araceli Coufal JR,JOHN Mateo Flow

## 2017-01-02 NOTE — Op Note (Signed)
01/02/2017  10:34 AM  PATIENT:  Kristen Cooper  51 y.o. female  PRE-OPERATIVE DIAGNOSIS:  MUSCLE WEAKNESS  POST-OPERATIVE DIAGNOSIS:  MUSCLE WEAKNESS  PROCEDURE:  Procedure(s): RIGHT LOWER EXTREMITY MUSCLE BIOPSY 2X2cm (Right)  SURGEON:  Surgeon(s) and Role:    Ralene Ok, MD - Primary  ANESTHESIA:   local and general  EBL: <5cc  Total I/O In: 250 [I.V.:250] Out: -   BLOOD ADMINISTERED:none  DRAINS: none   LOCAL MEDICATIONS USED:  BUPIVICAINE   SPECIMEN:  Source of Specimen:  Right lower quadriceps muscle biopsy  DISPOSITION OF SPECIMEN:  PATHOLOGY  COUNTS:  YES  TOURNIQUET:  * No tourniquets in log *  DICTATION: .Dragon Dictation The patient was taken to the OR and placed in the supine position with bilateral SCDs in place. The patient was prepped and draped in the usual sterile fashion. A time out was called and all facts were verified.   A 3cm incision was made over his R quadriceps muscle. Sharp dissection was taken down to his fascia. This was incised. A measrued muscle of 2x 2cm was Isolated with hemostats. This was sharply excised. Each cut end was tied off with 2-0 silk ties. The specimen was sent to pathology.   I checked for hemostasis and it was excellent. 3-0 vicryl were used to reapproximate the deep dermal layer. 4-0 monocryls were used to reapproximate the skin in a subcuticular fashion. Dermabond was used as a dressing.   The patient tolerated the procedure well. He was taken to the recovery room in stable condition.    PLAN OF CARE: Discharge to home after PACU  PATIENT DISPOSITION:  PACU - hemodynamically stable.   Delay start of Pharmacological VTE agent (>24hrs) due to surgical blood loss or risk of bleeding: not applicable

## 2017-01-02 NOTE — Discharge Instructions (Signed)
Muscle Biopsy, Care After Refer to this sheet in the next few weeks. These instructions provide you with information about caring for yourself after your procedure. Your health care provider may also give you more specific instructions. Your treatment has been planned according to current medical practices, but problems sometimes occur. Call your health care provider if you have any problems or questions after your procedure. What can I expect after the procedure? After your procedure, it is common to have soreness and tenderness at the site of the biopsy. This may last for a few days. Follow these instructions at home: Biopsy Site Care   Follow instructions from your health care provider about how to take care of your biopsy site. Make sure you: ? Change any bandages (dressings) as told by your health care provider. ? Wash your hands with soap and water before you change your bandage (dressing). If soap and water are not available, use hand sanitizer. ? Leave any stitches (sutures), skin glue, or adhesive strips in place. These skin closures may need to stay in place for 2 weeks or longer. If adhesive strip edges start to loosen and curl up, you may trim the loose edges. Do not remove adhesive strips completely unless your health care provider tells you to do that.  Check your biopsy site every day for signs of infection. Check for: ? More redness, swelling, or pain. ? More fluid or blood. ? Warmth. ? Pus or a bad smell. Activity  Limit activities or movements as told by your health care provider.  Do not drive for 24 hours if you received a medicine to help you relax (sedative). General instructions  Take over-the-counter and prescription medicines only as told by your health care provider.  Do not take baths, swim, or use a hot tub until your health care provider approves. Ask your health care provider if you can take showers.  Keep all follow-up visits as told by your health care  provider. This is important. Contact a health care provider if:  You have more redness, swelling, or pain around your biopsy site.  You have more fluid or blood coming from your biopsy site.  Your biopsy site feels warm to the touch.  You have pus or a bad smell coming from your biopsy site.  You have a fever.  You are light-headed or you feel faint. Get help right away if:  You develop a rash.  You have difficulty breathing.  You have numbness or tingling going down the arm or leg of your biopsy site. This information is not intended to replace advice given to you by your health care provider. Make sure you discuss any questions you have with your health care provider. Document Released: 01/26/2005 Document Revised: 03/08/2016 Document Reviewed: 04/12/2015 Elsevier Interactive Patient Education  2018 [facility] Care Instructions  Activity: Get plenty of rest for the remainder of the day. A responsible individual must stay with you for 24 hours following the procedure.  For the next 24 hours, DO NOT: -Drive a car -Paediatric nurse -Drink alcoholic beverages -Take any medication unless instructed by your physician -Make any legal decisions or sign important papers.  Meals: Start with liquid foods such as gelatin or soup. Progress to regular foods as tolerated. Avoid greasy, spicy, heavy foods. If nausea and/or vomiting occur, drink only clear liquids until the nausea and/or vomiting subsides. Call your physician if vomiting continues.  Special Instructions/Symptoms: Your throat may feel dry or sore from  the anesthesia or the breathing tube placed in your throat during surgery. If this causes discomfort, gargle with warm salt water. The discomfort should disappear within 24 hours.  If you had a scopolamine patch placed behind your ear for the management of post- operative nausea and/or vomiting:  1. The medication in the patch is effective for 72  hours, after which it should be removed.  Wrap patch in a tissue and discard in the trash. Wash hands thoroughly with soap and water. 2. You may remove the patch earlier than 72 hours if you experience unpleasant side effects which may include dry mouth, dizziness or visual disturbances. 3. Avoid touching the patch. Wash your hands with soap and water after contact with the patch.

## 2017-01-02 NOTE — Addendum Note (Signed)
Addendum  created 01/02/17 1213 by Lyn Hollingshead, MD   Anesthesia Attestations deleted

## 2017-01-02 NOTE — Anesthesia Procedure Notes (Signed)
Procedure Name: LMA Insertion Date/Time: 01/02/2017 10:09 AM Performed by: Wanita Chamberlain Pre-anesthesia Checklist: Patient identified, Emergency Drugs available, Suction available and Patient being monitored Patient Re-evaluated:Patient Re-evaluated prior to inductionOxygen Delivery Method: Circle system utilized Preoxygenation: Pre-oxygenation with 100% oxygen Intubation Type: IV induction Ventilation: Mask ventilation without difficulty LMA: LMA inserted LMA Size: 3.0 Number of attempts: 1 Tube secured with: Tape Dental Injury: Teeth and Oropharynx as per pre-operative assessment

## 2017-01-02 NOTE — Anesthesia Preprocedure Evaluation (Addendum)
Anesthesia Evaluation  Patient identified by MRN, date of birth, ID band Patient awake    Reviewed: Allergy & Precautions, NPO status , Patient's Chart, lab work & pertinent test results  Airway Mallampati: I  TM Distance: >3 FB Neck ROM: Full    Dental  (+) Teeth Intact, Dental Advisory Given   Pulmonary    Pulmonary exam normal        Cardiovascular Exercise Tolerance: Good hypertension, Pt. on medications Normal cardiovascular exam Rhythm:Regular Rate:Normal     Neuro/Psych negative neurological ROS  negative psych ROS   GI/Hepatic Neg liver ROS, Crohn's disease   Endo/Other  negative endocrine ROS  Renal/GU negative Renal ROS  negative genitourinary   Musculoskeletal Lower extremity muscle weakness   Abdominal   Peds  Hematology negative hematology ROS (+)   Anesthesia Other Findings   Reproductive/Obstetrics negative OB ROS                           Anesthesia Physical Anesthesia Plan  ASA: II  Anesthesia Plan: General   Post-op Pain Management:    Induction: Intravenous  PONV Risk Score and Plan: 2 and Ondansetron  Airway Management Planned: LMA  Additional Equipment:   Intra-op Plan:   Post-operative Plan: Extubation in OR  Informed Consent:   Plan Discussed with: CRNA and Surgeon  Anesthesia Plan Comments:       Anesthesia Quick Evaluation

## 2017-01-02 NOTE — Transfer of Care (Signed)
  Last Vitals:  Vitals:   01/02/17 0826  BP: (!) 115/58  Pulse: 94  Resp: 16  Temp: 36.7 C    Last Pain:  Vitals:   01/02/17 0846  TempSrc:   PainSc: 2       Patients Stated Pain Goal: 7 (01/02/17 4081)  Immediate Anesthesia Transfer of Care Note  Patient: Kristen Cooper  Procedure(s) Performed: Procedure(s) (LRB): UPPER RIGHT  EXTREMITY MUSCLE BIOPSY (Right)  Patient Location: PACU  Anesthesia Type: General  Level of Consciousness: awake, alert  and oriented  Airway & Oxygen Therapy: Patient Spontanous Breathing and Patient connected to face mask oxygen  Post-op Assessment: Report given to PACU RN and Post -op Vital signs reviewed and stable  Post vital signs: Reviewed and stable  Complications: No apparent anesthesia complications

## 2017-01-03 ENCOUNTER — Encounter (HOSPITAL_BASED_OUTPATIENT_CLINIC_OR_DEPARTMENT_OTHER): Payer: Self-pay | Admitting: General Surgery

## 2017-01-29 ENCOUNTER — Encounter (HOSPITAL_COMMUNITY): Payer: Self-pay

## 2017-05-10 IMAGING — MR MR FEMUR*R* W/O CM
3 of 8 series · 9 of 40 positions shown · non-contrast
Comparison: None.

CLINICAL DATA: Bilateral upper leg stiffness for 4 months.
Bilateral thigh weakness and elevated CK.

EXAM:
MR OF THE LEFT FEMUR WITHOUT CONTRAST; MRI OF THE RIGHT FEMUR
WITHOUT CONTRAST
TECHNIQUE: Multiplanar, multisequence MR imaging of the upper legs was
performed. No intravenous contrast was administered.

[Series 6: T2 fat-sat · axial · right · 7.0mm · 0.45mm/px · z∈[-175,+186]mm · 3 of 40 slices shown (1 of 3)]
[im 1/40]
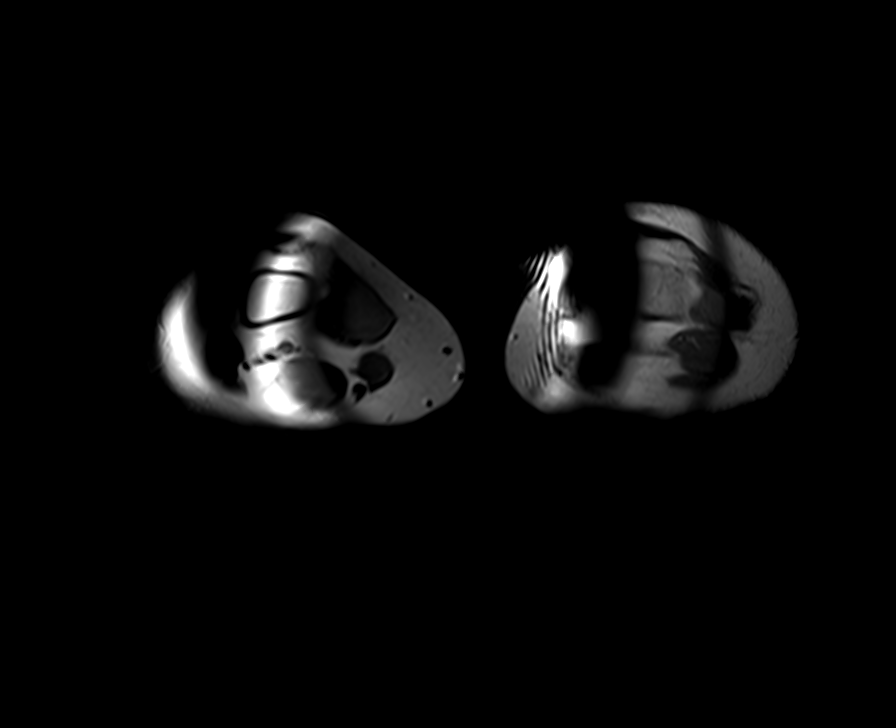
[im 20/40]
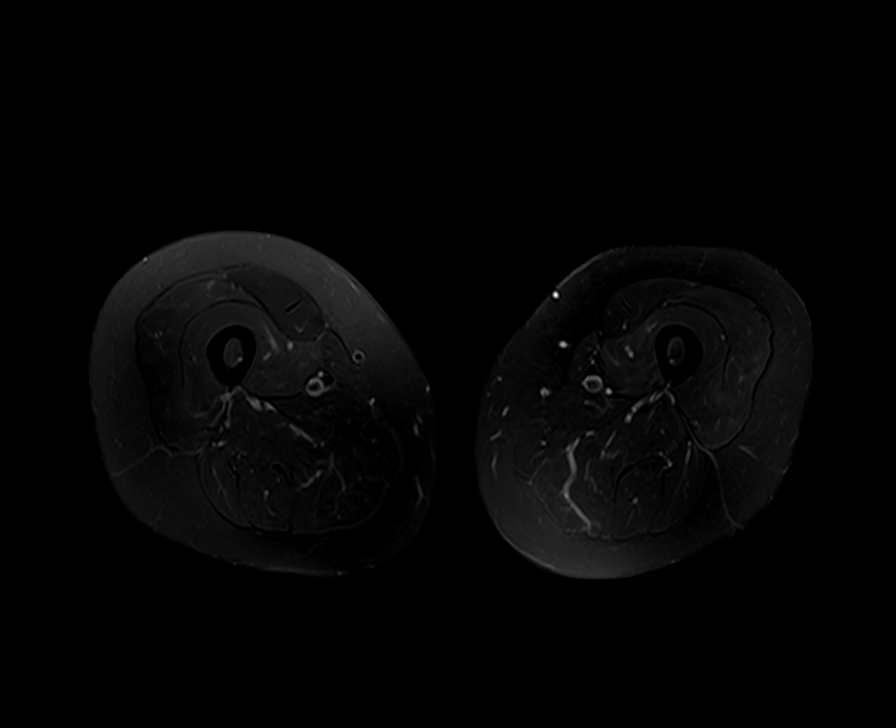
[im 40/40]
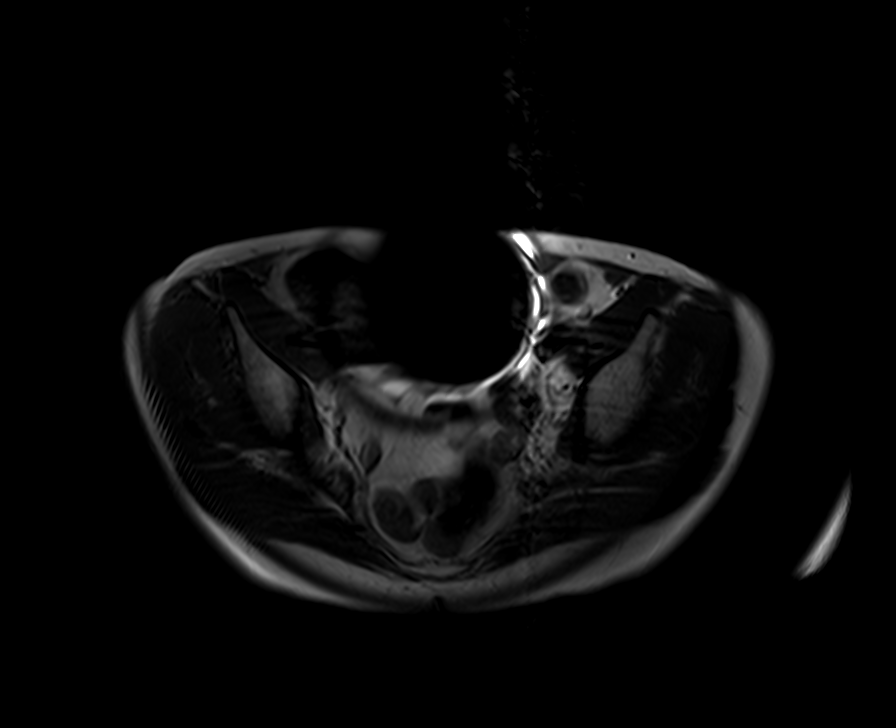

[Series 8: T2 fat-sat · coronal · right · 3.0mm · 0.49mm/px · 3 of 40 slices shown (2 of 3)]
[im 1/40]
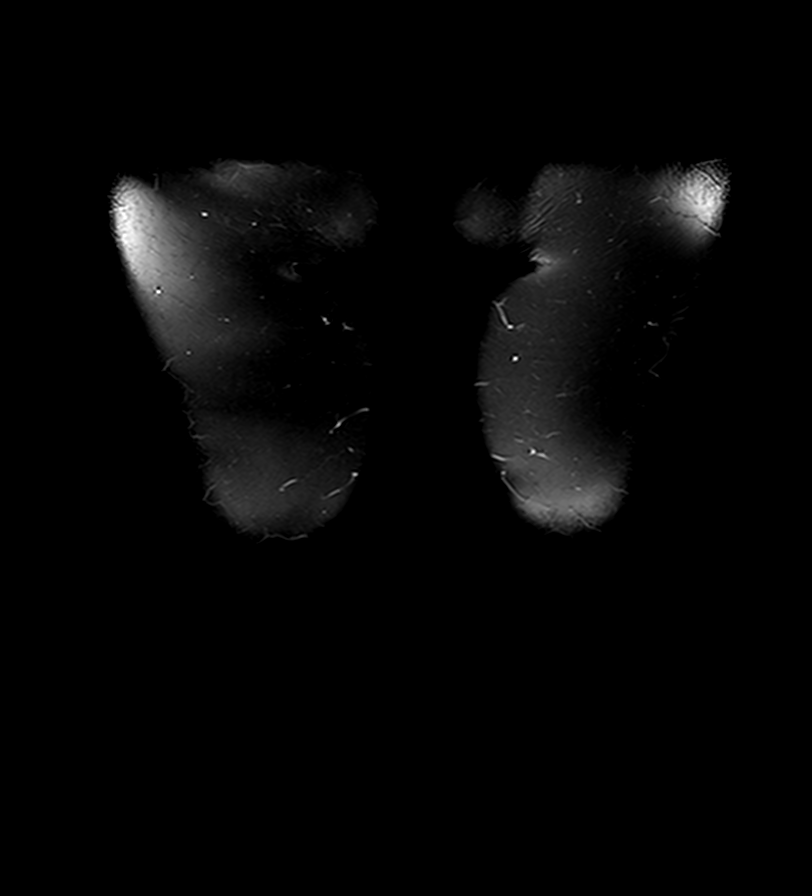
[im 20/40]
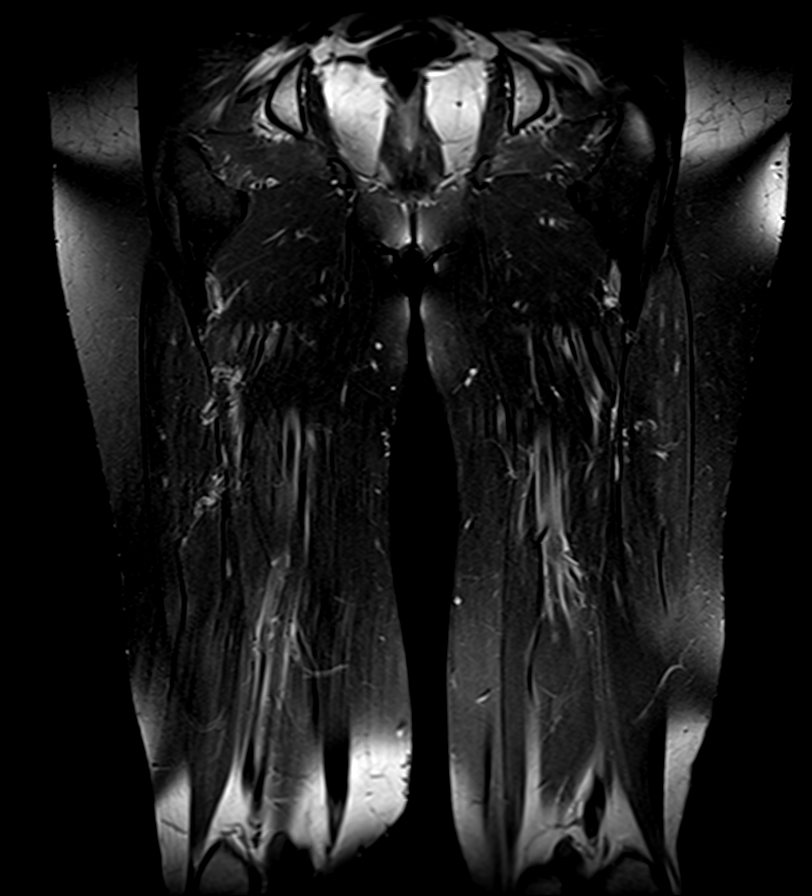
[im 40/40]
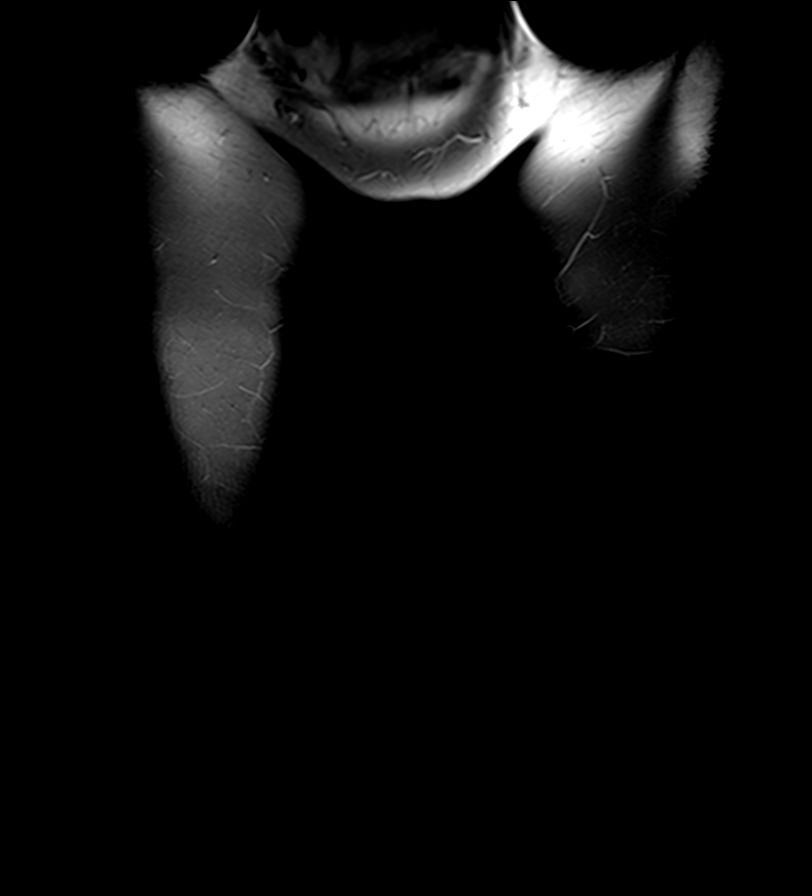

[Series 12: T2 fat-sat · sagittal · right · 3.0mm · 0.49mm/px · 3 of 36 slices shown (3 of 3)]
[im 1/36]
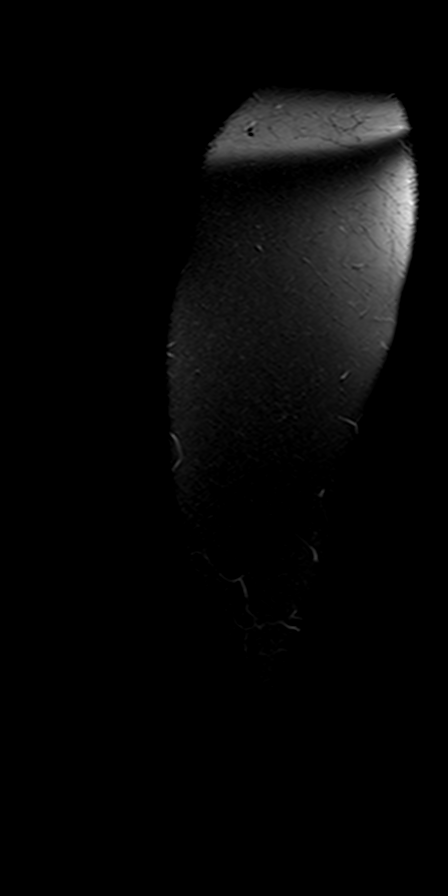
[im 18/36]
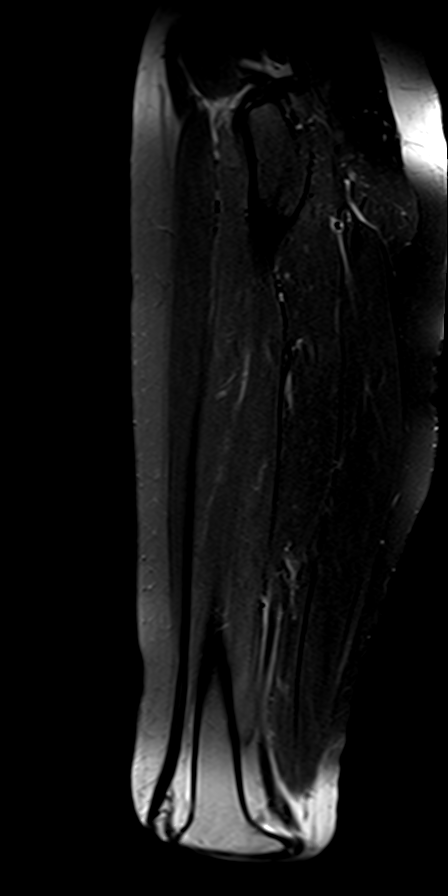
[im 36/36]
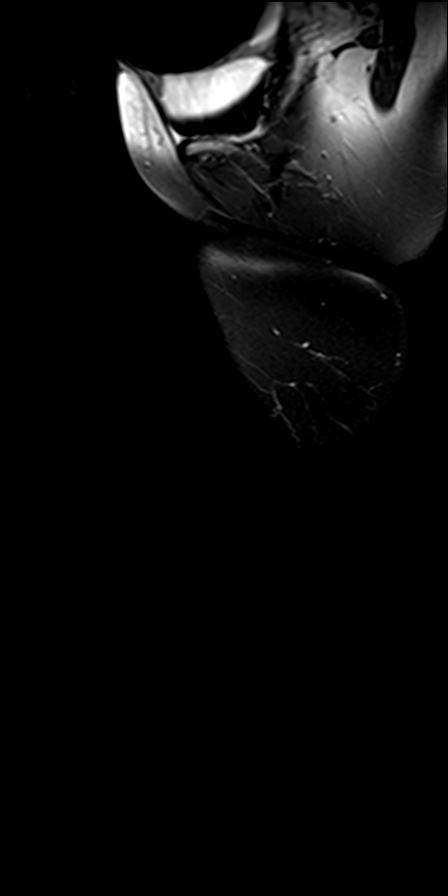

[9 of 40 positions shown; findings below may reference images not displayed]

FINDINGS: Bones/Joint/Cartilage

All imaged bones demonstrate normal signal without focal lesion,
stress change or mass.

Ligaments

Intact.

Muscles and Tendons

No atrophy is identified. There is subtle increased T2 signal in the
vastus musculature of the distal lower legs bilaterally. Signal
changes more conspicuous on the left. No tear is identified.

Soft tissues

Normal throughout.  No fluid collection or mass.
IMPRESSION: Subtle increased T2 signal in the vastus musculature of the upper
legs bilaterally is most conspicuous distally and on the left.
Findings are most consistent with infectious or inflammatory
myositis. No fatty atrophy is identified.

## 2017-07-10 ENCOUNTER — Other Ambulatory Visit (INDEPENDENT_AMBULATORY_CARE_PROVIDER_SITE_OTHER): Payer: Self-pay | Admitting: Internal Medicine

## 2017-11-25 ENCOUNTER — Encounter (INDEPENDENT_AMBULATORY_CARE_PROVIDER_SITE_OTHER): Payer: Self-pay | Admitting: Internal Medicine

## 2017-11-25 ENCOUNTER — Ambulatory Visit (INDEPENDENT_AMBULATORY_CARE_PROVIDER_SITE_OTHER): Payer: BC Managed Care – PPO | Admitting: Internal Medicine

## 2017-11-25 VITALS — BP 104/70 | HR 68 | Temp 98.1°F | Ht 63.0 in | Wt 113.5 lb

## 2017-11-25 DIAGNOSIS — K5010 Crohn's disease of large intestine without complications: Secondary | ICD-10-CM | POA: Diagnosis not present

## 2017-11-25 LAB — CBC WITH DIFFERENTIAL/PLATELET
BASOS ABS: 38 {cells}/uL (ref 0–200)
Basophils Relative: 0.6 %
Eosinophils Absolute: 90 cells/uL (ref 15–500)
Eosinophils Relative: 1.4 %
HEMATOCRIT: 39.1 % (ref 35.0–45.0)
Hemoglobin: 13.3 g/dL (ref 11.7–15.5)
LYMPHS ABS: 2938 {cells}/uL (ref 850–3900)
MCH: 30.8 pg (ref 27.0–33.0)
MCHC: 34 g/dL (ref 32.0–36.0)
MCV: 90.5 fL (ref 80.0–100.0)
MPV: 9.5 fL (ref 7.5–12.5)
Monocytes Relative: 9.9 %
NEUTROS PCT: 42.2 %
Neutro Abs: 2701 cells/uL (ref 1500–7800)
Platelets: 343 10*3/uL (ref 140–400)
RBC: 4.32 10*6/uL (ref 3.80–5.10)
RDW: 12.9 % (ref 11.0–15.0)
Total Lymphocyte: 45.9 %
WBC mixed population: 634 cells/uL (ref 200–950)
WBC: 6.4 10*3/uL (ref 3.8–10.8)

## 2017-11-25 LAB — SEDIMENTATION RATE: Sed Rate: 14 mm/h (ref 0–30)

## 2017-11-25 NOTE — Progress Notes (Signed)
Subjective:    Patient ID: Kristen Cooper, female    DOB: January 08, 1966, 52 y.o.   MRN: [id number]  HPI Here today for f/u. Last seen I February of 2017. Hx of Crohn's and maintained on Pentasa  2 tabs qid.  Underwent a surveillance colonoscopy in 2017 which revealed: Impression:               - A few erosions in the terminal ileum. Biopsied.                           - Erythematous, eroded and ulcerated mucosa in the                            sigmoid colon, in the descending colon, in the                            transverse colon, in the ascending colon and in the                            cecum. Biopsied.                           - One 6 mm polyp in the sigmoid colon, removed with                            a cold snare. Resected and retrieved.                           - External hemorrhoids.                           - Biopsies taken from terminal ileum, cecum and                            sigmoid colon. She takes Remicade for arthritis and Crohn's disease. She receives Remicade infusion at [facility]. She will find out the doses for me. She was receiving Remicade 300 mg in NS every 8 weeks [facility], but her insurance stopped paying for this.  She tells me she is doing well since being on the Remicade. She is having x 1 a day.; No melena or BBRB. No mucous.  He appetite is good.   Hx of poly arthritis and neuropathy.   Review of Systems Past Medical History:  Diagnosis Date  . Crohn disease [facility]) GI-- dr setzer/ dr Laural Golden:   rheumologist-  dr Amil Amen   dx [phone]  . Dermatography 08/2012   To many histamine cells  . Hypertension   . Inflammatory bowel disease     Past Surgical History:  Procedure Laterality Date  . APPENDECTOMY  1987  . COLONOSCOPY N/A 10/27/2015   Procedure: COLONOSCOPY;  Surgeon: Rogene Houston, MD;  [facility];  Service: Endoscopy;  Laterality: N/A;  11:00  . MUSCLE BIOPSY Right 01/02/2017   Procedure: UPPER RIGHT  EXTREMITY MUSCLE  BIOPSY;  Surgeon: Ralene Ok, MD;  Location: [facility];  Service: General;  Laterality: Right;  . UPPER GASTROINTESTINAL ENDOSCOPY  ?    Allergies  Allergen Reactions  . Oatmeal  Current Outpatient Medications on File Prior to Visit  Medication Sig Dispense Refill  . acetaminophen (TYLENOL) 650 MG CR tablet Take 1,300 mg by mouth 2 (two) times daily as needed for pain.    . cetirizine (ZYRTEC) 10 MG tablet Take 10 mg by mouth at bedtime.     . fexofenadine (ALLEGRA) 180 MG tablet Take 180 mg by mouth every morning.     Marland Kitchen lisinopril-hydrochlorothiazide (PRINZIDE,ZESTORETIC) 10-12.5 MG per tablet Take 0.5 tablets by mouth every morning.     Marland Kitchen PENTASA 500 MG CR capsule TAKE (2) CAPSULES FOUR TIMES DAILY. 240 capsule 11   No current facility-administered medications on file prior to visit.         Objective:   Physical Exam Blood pressure 104/70, pulse 68, temperature 98.1 F (36.7 C), height 5' 3"  (1.6 m), weight 113 lb 8 oz (51.5 kg).  Alert and oriented. Skin warm and dry. Oral mucosa is moist.   . Sclera anicteric, conjunctivae is pink. Thyroid not enlarged. No cervical lymphadenopathy. Lungs clear. Heart regular rate and rhythm.  Abdomen is soft. Bowel sounds are positive. No hepatomegaly. No abdominal masses felt. No tenderness.  No edema to lower extremities.         Assessment & Plan:  Crohn's disease. She seems to be doing well. She will call with the Remicade dose. She will also let me know the dose of the Methotrexate.  OV in 1 year.

## 2017-11-25 NOTE — Patient Instructions (Signed)
OV in 1 year.  

## 2017-12-02 NOTE — Addendum Note (Signed)
Addended by: Butch Penny on: 12/02/2017 10:43 AM   Modules accepted: Orders

## 2018-04-02 ENCOUNTER — Ambulatory Visit: Payer: BC Managed Care – PPO | Admitting: Neurology

## 2018-04-02 ENCOUNTER — Encounter: Payer: Self-pay | Admitting: Neurology

## 2018-04-02 ENCOUNTER — Ambulatory Visit (INDEPENDENT_AMBULATORY_CARE_PROVIDER_SITE_OTHER): Payer: BC Managed Care – PPO | Admitting: Neurology

## 2018-04-02 DIAGNOSIS — G7249 Other inflammatory and immune myopathies, not elsewhere classified: Secondary | ICD-10-CM

## 2018-04-02 NOTE — Progress Notes (Signed)
Please refer to EMG and nerve conduction study procedure note. 

## 2018-04-02 NOTE — Procedures (Signed)
     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  The patient has perceived ongoing weakness of both lower extremities.  She is being evaluated for this issue.  She is not on steroid therapy currently.  NERVE CONDUCTION STUDIES:  Nerve conduction studies were performed on both lower extremities. The distal motor latencies and motor amplitudes for the peroneal and posterior tibial nerves were within normal limits. The nerve conduction velocities for these nerves were also normal. The sensory latencies for the peroneal and sural nerves on the right were within normal limits. The F wave latencies for the posterior tibial nerves were within normal limits.   EMG STUDIES:  EMG study was performed on the right lower extremity:  The tibialis anterior muscle reveals 2 to 3K motor units with full recruitment. No fibrillations or positive waves were seen. The peroneus tertius muscle reveals 2 to 3K motor units with full recruitment. No fibrillations or positive waves were seen. The medial gastrocnemius muscle reveals 1 to 3K motor units with full recruitment. No fibrillations or positive waves were seen. The vastus lateralis muscle reveals 2 to 4K motor units with full recruitment. No fibrillations or positive waves were seen. The iliopsoas muscle reveals 2 to 3K motor units with full recruitment. No fibrillations or positive waves were seen. The biceps femoris muscle (long head) reveals 2 to 3K motor units with full recruitment. No fibrillations or positive waves were seen. The lumbosacral paraspinal muscles were tested at 3 levels, and revealed no abnormalities of insertional activity at all 3 levels tested. There was good relaxation.   IMPRESSION:  Nerve conduction studies done on both lower extremities were unremarkable, no evidence of a peripheral neuropathy was seen.  EMG evaluation of the right lower extremity was  essentially normal, there is no evidence of an active inflammatory myositis at this time.  Jill Alexanders MD 04/02/2018 3:15 PM  [facility] [address] [place], [place] [address]  Phone [phone] Fax [phone]

## 2018-04-03 NOTE — Progress Notes (Signed)
[place]    Nerve / Sites Muscle Latency Ref. Amplitude Ref. Rel Amp Segments Distance Velocity Ref. Area    ms ms mV mV %  cm m/s m/s mVms  R Peroneal - EDB     Ankle EDB 4.5 ?6.5 2.9 ?2.0 100 Ankle - EDB 9   10.8     Fib head EDB 9.9  2.7  91 Fib head - Ankle 28 52 ?44 10.3     Pop fossa EDB 12.0  2.5  93.9 Pop fossa - Fib head 10 48 ?44 11.5         Pop fossa - Ankle      L Peroneal - EDB     Ankle EDB 4.0 ?6.5 2.8 ?2.0 100 Ankle - EDB 9   10.6     Fib head EDB 9.4  2.5  89.6 Fib head - Ankle 28 52 ?44 9.9     Pop fossa EDB 11.4  2.3  94.1 Pop fossa - Fib head 10 51 ?44 9.7         Pop fossa - Ankle      R Tibial - AH     Ankle AH 4.4 ?5.8 9.8 ?4.0 100 Ankle - AH 9   26.6     Pop fossa AH 12.0  5.3  53.8 Pop fossa - Ankle 38 50 ?41 24.1  L Tibial - AH     Ankle AH 4.3 ?5.8 10.6 ?4.0 100 Ankle - AH 9   34.4     Pop fossa AH 11.1  8.6  81.4 Pop fossa - Ankle 38 55 ?41 33.7             SNC    Nerve / Sites Rec. Site Peak Lat Ref.  Amp Ref. Segments Distance    ms ms V V  cm  R Sural - Ankle (Calf)     Calf Ankle 3.7 ?4.4 6 ?6 Calf - Ankle 14  R Superficial peroneal - Ankle     Lat leg Ankle 3.4 ?4.4 22 ?6 Lat leg - Ankle 14         F  Wave    Nerve F Lat Ref.   ms ms  R Tibial - AH 46.3 ?56.0  L Tibial - AH 47.9 ?56.0

## 2018-05-26 ENCOUNTER — Ambulatory Visit (INDEPENDENT_AMBULATORY_CARE_PROVIDER_SITE_OTHER): Payer: BC Managed Care – PPO | Admitting: Internal Medicine

## 2018-06-09 ENCOUNTER — Other Ambulatory Visit (INDEPENDENT_AMBULATORY_CARE_PROVIDER_SITE_OTHER): Payer: Self-pay | Admitting: Internal Medicine

## 2018-10-28 ENCOUNTER — Encounter: Payer: Self-pay | Admitting: Internal Medicine

## 2018-11-27 ENCOUNTER — Ambulatory Visit (INDEPENDENT_AMBULATORY_CARE_PROVIDER_SITE_OTHER): Payer: BC Managed Care – PPO | Admitting: Internal Medicine

## 2018-11-27 ENCOUNTER — Other Ambulatory Visit: Payer: Self-pay

## 2018-11-27 ENCOUNTER — Encounter (INDEPENDENT_AMBULATORY_CARE_PROVIDER_SITE_OTHER): Payer: Self-pay | Admitting: Internal Medicine

## 2018-11-27 DIAGNOSIS — K5090 Crohn's disease, unspecified, without complications: Secondary | ICD-10-CM

## 2018-11-27 NOTE — Progress Notes (Signed)
   Subjective:    Patient ID: Kristen Cooper, female    DOB: 10-Jan-1966, 53 y.o.   MRN: [id number]  HPI Start time 1210pm. End time 1220pm. Total time 10 minutes.  Patient consents to this telephone OV. She is at home. I am in the office. Video OV attempted. Telephone OV due to COVID-19 risk. F/u for Crohn's disease. She tells me she is doing good. She receives Remicade every 8 week at [facility]. She is also maintained on Pentasa 500mg  two tabs qid. Last seen in May of 2019. Her appetite is good. She has changed her eating habits. She has cut out sugars and starches. She is eating more fruit an nuts.  She has lost about 13 pounds since her last visit. This morning her B/p was 100/70. She denies any abdominal or joint pain.  She is having a BM x 4 a day . No melena or BRRB. She is exercises daily by walking and resistance training.      Review of Systems Past Medical History:  Diagnosis Date  . Crohn disease [facility]) GI-- dr Marsi Turvey/ dr Laural Golden:   rheumologist-  dr Amil Amen   dx [phone]  . Dermatography 08/2012   To many histamine cells  . Hypertension   . Inflammatory bowel disease     Past Surgical History:  Procedure Laterality Date  . APPENDECTOMY  1987  . COLONOSCOPY N/A 10/27/2015   Procedure: COLONOSCOPY;  Surgeon: Rogene Houston, MD;  [facility];  Service: Endoscopy;  Laterality: N/A;  11:00  . MUSCLE BIOPSY Right 01/02/2017   Procedure: UPPER RIGHT  EXTREMITY MUSCLE BIOPSY;  Surgeon: Ralene Ok, MD;  Location: [facility];  Service: General;  Laterality: Right;  . UPPER GASTROINTESTINAL ENDOSCOPY  ?    Allergies  Allergen Reactions  . Oatmeal     Current Outpatient Medications on File Prior to Visit  Medication Sig Dispense Refill  . acetaminophen (TYLENOL) 650 MG CR tablet Take 1,300 mg by mouth 2 (two) times daily as needed for pain.    . cetirizine (ZYRTEC) 10 MG tablet Take 10 mg by mouth at bedtime.     . fexofenadine (ALLEGRA)  180 MG tablet Take 180 mg by mouth every morning.     . folic acid (FOLVITE) 572 MCG tablet daily. Sq once a week    . inFLIXimab (REMICADE IV) Inject into the vein. IV once every 8 weeks.    Marland Kitchen lisinopril-hydrochlorothiazide (PRINZIDE,ZESTORETIC) 10-12.5 MG per tablet Take 0.5 tablets by mouth every morning.     . methotrexate (50 MG/ML) 1 g injection Inject into the vein once.    . Methotrexate, PF, 10 MG/0.4ML SOAJ Inject 0.4 mLs into the skin once a week.    Marland Kitchen PENTASA 500 MG CR capsule TAKE (2) CAPSULES FOUR TIMES DAILY. 240 capsule 5   No current facility-administered medications on file prior to visit.         Objective:   Physical Exam  Deferred.       Assessment & Plan:  Crohn's disease. She is doing well. She has no GI complaints. Will get labs from [facility].  She will have OV in 1 year.

## 2018-12-23 ENCOUNTER — Encounter: Payer: Self-pay | Admitting: Internal Medicine

## 2019-02-16 ENCOUNTER — Other Ambulatory Visit (INDEPENDENT_AMBULATORY_CARE_PROVIDER_SITE_OTHER): Payer: Self-pay | Admitting: Internal Medicine

## 2019-03-23 ENCOUNTER — Other Ambulatory Visit (INDEPENDENT_AMBULATORY_CARE_PROVIDER_SITE_OTHER): Payer: Self-pay | Admitting: Internal Medicine

## 2019-04-27 ENCOUNTER — Other Ambulatory Visit (INDEPENDENT_AMBULATORY_CARE_PROVIDER_SITE_OTHER): Payer: Self-pay | Admitting: Internal Medicine

## 2019-06-08 ENCOUNTER — Other Ambulatory Visit (INDEPENDENT_AMBULATORY_CARE_PROVIDER_SITE_OTHER): Payer: Self-pay | Admitting: Internal Medicine

## 2019-11-25 ENCOUNTER — Other Ambulatory Visit (INDEPENDENT_AMBULATORY_CARE_PROVIDER_SITE_OTHER): Payer: Self-pay | Admitting: Internal Medicine

## 2019-11-30 ENCOUNTER — Ambulatory Visit (INDEPENDENT_AMBULATORY_CARE_PROVIDER_SITE_OTHER): Payer: BC Managed Care – PPO | Admitting: Nurse Practitioner

## 2019-12-02 ENCOUNTER — Ambulatory Visit (INDEPENDENT_AMBULATORY_CARE_PROVIDER_SITE_OTHER): Payer: BC Managed Care – PPO | Admitting: Gastroenterology

## 2020-01-07 ENCOUNTER — Ambulatory Visit (INDEPENDENT_AMBULATORY_CARE_PROVIDER_SITE_OTHER): Payer: BC Managed Care – PPO | Admitting: Gastroenterology

## 2020-01-07 ENCOUNTER — Other Ambulatory Visit: Payer: Self-pay

## 2020-01-07 ENCOUNTER — Encounter (INDEPENDENT_AMBULATORY_CARE_PROVIDER_SITE_OTHER): Payer: Self-pay | Admitting: Gastroenterology

## 2020-01-07 VITALS — BP 100/65 | HR 92 | Temp 98.3°F | Ht 63.0 in | Wt 105.3 lb

## 2020-01-07 DIAGNOSIS — Z86010 Personal history of colonic polyps: Secondary | ICD-10-CM | POA: Diagnosis not present

## 2020-01-07 DIAGNOSIS — R1310 Dysphagia, unspecified: Secondary | ICD-10-CM

## 2020-01-07 DIAGNOSIS — K5090 Crohn's disease, unspecified, without complications: Secondary | ICD-10-CM

## 2020-01-07 NOTE — Progress Notes (Signed)
Patient profile: Kristen Cooper is a 54 y.o. female seen for evaluation of crohn's disease-diagnosed in 1987, maintained on remicade q8 weeks at [facility] rheumatology since 2017 and pentasa 558m 2 tab QID.    History of Present Illness: Kristen Cooper seen today for follow-up of Crohn's disease, patient reports her GI symptoms are doing well and is usually having 1-2 bowel movements a day.  These are typically formed.  She denies any constipation, diarrhea, melena, rectal bleeding.  No lower abdominal pain. She only gets symptoms if she eats a lot of roughage such as large salads, grapes, cucumbers.  She has lost weight as below but reports this is intentional weight loss due to diet changes of limiting carbs.  Very rare GERD symptoms of eats spicy foods.  She does note over the past year having some dysphagia predominantly in her midesophagus to foods or pills.  Feels sometimes feels liquids go slow.  Occurring 1-2 times a month.  Last upper endoscopy 20 years ago.  Wt Readings from Last 3 Encounters:  01/07/20 105 lb 4.8 oz (47.8 kg)  11/25/17 113 lb 8 oz (51.5 kg)  01/02/17 115 lb (52.2 kg)     Last Colonoscopy: 2017-Erosions in the terminal ileum, erythematous eroded ulcerated mucosa in sigmoid, descending, transverse, ascending, cecum.  6 mm polyp sigmoid.  External hemorrhoids.  Polyp was tubular adenoma.  5-year repeat recommended.     Past Medical History:  Past Medical History:  Diagnosis Date  . Crohn disease ([facility] GI-- dr setzer/ dr rLaural Golden   rheumologist-  dr bAmil Amen  dx [phone] . Dermatography 08/2012   To many histamine cells  . Hypertension   . Inflammatory bowel disease     Problem List: Patient Active Problem List   Diagnosis Date Noted  . Crohn disease ([facility] 09/21/2013    Past Surgical History: Past Surgical History:  Procedure Laterality Date  . APPENDECTOMY  1987  . COLONOSCOPY N/A 10/27/2015   Procedure: COLONOSCOPY;  Surgeon: NRogene Houston MD;   [facility];  Service: Endoscopy;  Laterality: N/A;  11:00  . MUSCLE BIOPSY Right 01/02/2017   Procedure: UPPER RIGHT  EXTREMITY MUSCLE BIOPSY;  Surgeon: RRalene Ok MD;  Location[place]  Service: General;  Laterality: Right;  . UPPER GASTROINTESTINAL ENDOSCOPY  ?    Allergies: Allergies  Allergen Reactions  . Oatmeal       Home Medications:  Current Outpatient Medications:  .  acetaminophen (TYLENOL) 650 MG CR tablet, Take 1,300 mg by mouth 2 (two) times daily as needed for pain., Disp: , Rfl:  .  cetirizine (ZYRTEC) 10 MG tablet, Take 10 mg by mouth at bedtime. , Disp: , Rfl:  .  fexofenadine (ALLEGRA) 180 MG tablet, Take 180 mg by mouth every morning. , Disp: , Rfl:  .  folic acid (FOLVITE) 4960MCG tablet, 800 mcg in the morning and at bedtime. Sq once a week , Disp: , Rfl:  .  inFLIXimab (REMICADE IV), Inject into the vein. IV once every 8 weeks., Disp: , Rfl:  .  lisinopril-hydrochlorothiazide (PRINZIDE,ZESTORETIC) 10-12.5 MG per tablet, Take 0.5 tablets by mouth every morning. , Disp: , Rfl:  .  methotrexate (50 MG/ML) 1 g injection, Inject into the vein once., Disp: , Rfl:  .  Methotrexate, PF, 10 MG/0.4ML SOAJ, Inject 0.4 mLs into the skin once a week., Disp: , Rfl:  .  PENTASA 500 MG CR capsule, TAKE (2) CAPSULES FOUR  TIMES DAILY., Disp: 240 capsule, Rfl: 5 .  Probiotic Product (PROBIOTIC DAILY PO), Take by mouth daily., Disp: , Rfl:    Family History: family history includes Crohn's disease in her mother; Healthy in her brother and brother; Hypertension in her brother; Irritable bowel syndrome in her father.    Social History:   reports that she has never smoked. She has never used smokeless tobacco. She reports that she does not drink alcohol and does not use drugs.   Review of Systems: Constitutional: Denies weight loss/weight gain  Eyes: No changes in vision. ENT: No oral lesions, sore throat.  GI: see HPI.  Heme/Lymph: No easy  bruising.  CV: No chest pain.  GU: No hematuria.  Integumentary: No rashes.  Neuro: No headaches.  Psych: No depression/anxiety.  Endocrine: No heat/cold intolerance.  Allergic/Immunologic: No urticaria.  Resp: No cough, SOB.  Musculoskeletal: No joint swelling.    Physical Examination: BP 100/65 (BP Location: Right Arm, Patient Position: Sitting, Cuff Size: Normal)   Pulse 92   Temp 98.3 F (36.8 C) (Oral)   Ht 5' 3"  (1.6 m)   Wt 105 lb 4.8 oz (47.8 kg)   BMI 18.65 kg/m  Gen: NAD, alert and oriented x 4 HEENT: PEERLA, EOMI, Neck: supple, no JVD Chest: CTA bilaterally, no wheezes, crackles, or other adventitious sounds CV: RRR, no m/g/c/r Abd: soft, NT, ND, +BS in all four quadrants; no HSM, guarding, ridigity, or rebound tenderness Ext: no edema, well perfused with 2+ pulses, Skin: no rash or lesions noted on observed skin Lymph: no noted LAD  Data Reviewed:   12/2018-CBC normal, CMP normal, negative hepatitis panel and quantiferon gold   Assessment/Plan: Ms. Stalker is a 54 y.o. female   1.  Dysphagia-mainly pills and foods with occasional liquids, last upper endoscopy 20 years ago.  Will arrange EGD for evaluation.  2.  Crohn's-clinically in remission on Remicade every 8 weeks, methotrexate once a week and Pentasa.  She is followed by [facility] rheumatology, reports labs have been normal recently, request these.  She will contact us with any concerning GI symptoms in interim.  She is due for colonoscopy next year.  Patient denies CP, SOB, and use of blood thinners. I discussed the risks and benefits of procedure including bleeding, perforation, infection, missed lesions, medication reactions and possible hospitalization or surgery if complications. All questions answered.   Kristen Cooper was seen today for follow-up.  Diagnoses and all orders for this visit:  Dysphagia, unspecified type  Crohn's disease without complication, unspecified gastrointestinal tract location  [facility])  Personal history of colonic polyps       I personally performed the service, non-incident to. (WP)  Laurine Blazer, [facility]

## 2020-01-07 NOTE — Patient Instructions (Signed)
We are scheduling an endoscopy for evaluation and requesting labs from rheumatology. Please call w/ any questions

## 2020-01-13 ENCOUNTER — Other Ambulatory Visit (INDEPENDENT_AMBULATORY_CARE_PROVIDER_SITE_OTHER): Payer: Self-pay | Admitting: *Deleted

## 2020-01-18 ENCOUNTER — Encounter (INDEPENDENT_AMBULATORY_CARE_PROVIDER_SITE_OTHER): Payer: Self-pay | Admitting: *Deleted

## 2020-01-22 ENCOUNTER — Encounter (INDEPENDENT_AMBULATORY_CARE_PROVIDER_SITE_OTHER): Payer: Self-pay | Admitting: *Deleted

## 2020-01-22 ENCOUNTER — Other Ambulatory Visit (INDEPENDENT_AMBULATORY_CARE_PROVIDER_SITE_OTHER): Payer: Self-pay | Admitting: *Deleted

## 2020-03-01 ENCOUNTER — Other Ambulatory Visit (HOSPITAL_COMMUNITY)
Admission: RE | Admit: 2020-03-01 | Discharge: 2020-03-01 | Disposition: A | Discharge status: Home / Self Care | Payer: BC Managed Care – PPO | Source: Ambulatory Visit | Attending: Internal Medicine | Admitting: Internal Medicine

## 2020-03-01 ENCOUNTER — Other Ambulatory Visit: Payer: Self-pay

## 2020-03-01 DIAGNOSIS — Z01812 Encounter for preprocedural laboratory examination: Secondary | ICD-10-CM | POA: Insufficient documentation

## 2020-03-01 DIAGNOSIS — Z20822 Contact with and (suspected) exposure to covid-19: Secondary | ICD-10-CM | POA: Insufficient documentation

## 2020-03-02 LAB — SARS CORONAVIRUS 2 (TAT 6-24 HRS): SARS Coronavirus 2: NEGATIVE

## 2020-03-03 ENCOUNTER — Encounter (HOSPITAL_COMMUNITY)
Admission: RE | Disposition: A | Discharge status: Home / Self Care | Payer: Self-pay | Source: Home / Self Care | Attending: Internal Medicine

## 2020-03-03 ENCOUNTER — Encounter (HOSPITAL_COMMUNITY): Payer: Self-pay | Admitting: Internal Medicine

## 2020-03-03 ENCOUNTER — Ambulatory Visit (HOSPITAL_COMMUNITY)
Admission: RE | Admit: 2020-03-03 | Discharge: 2020-03-03 | Disposition: A | Discharge status: Home / Self Care | Payer: BC Managed Care – PPO | Attending: Internal Medicine | Admitting: Internal Medicine

## 2020-03-03 ENCOUNTER — Other Ambulatory Visit: Payer: Self-pay

## 2020-03-03 DIAGNOSIS — K222 Esophageal obstruction: Secondary | ICD-10-CM | POA: Diagnosis not present

## 2020-03-03 DIAGNOSIS — K5090 Crohn's disease, unspecified, without complications: Secondary | ICD-10-CM | POA: Insufficient documentation

## 2020-03-03 DIAGNOSIS — K2100 Gastro-esophageal reflux disease with esophagitis, without bleeding: Secondary | ICD-10-CM | POA: Diagnosis not present

## 2020-03-03 DIAGNOSIS — Z8249 Family history of ischemic heart disease and other diseases of the circulatory system: Secondary | ICD-10-CM | POA: Insufficient documentation

## 2020-03-03 DIAGNOSIS — Z79899 Other long term (current) drug therapy: Secondary | ICD-10-CM | POA: Insufficient documentation

## 2020-03-03 DIAGNOSIS — M3320 Polymyositis, organ involvement unspecified: Secondary | ICD-10-CM | POA: Insufficient documentation

## 2020-03-03 DIAGNOSIS — I10 Essential (primary) hypertension: Secondary | ICD-10-CM | POA: Insufficient documentation

## 2020-03-03 DIAGNOSIS — Z8379 Family history of other diseases of the digestive system: Secondary | ICD-10-CM | POA: Diagnosis not present

## 2020-03-03 DIAGNOSIS — R1314 Dysphagia, pharyngoesophageal phase: Secondary | ICD-10-CM | POA: Insufficient documentation

## 2020-03-03 DIAGNOSIS — R1310 Dysphagia, unspecified: Secondary | ICD-10-CM

## 2020-03-03 DIAGNOSIS — K449 Diaphragmatic hernia without obstruction or gangrene: Secondary | ICD-10-CM | POA: Diagnosis not present

## 2020-03-03 DIAGNOSIS — M130 Polyarthritis, unspecified: Secondary | ICD-10-CM | POA: Insufficient documentation

## 2020-03-03 HISTORY — PX: ESOPHAGOGASTRODUODENOSCOPY: SHX5428

## 2020-03-03 HISTORY — PX: ESOPHAGEAL DILATION: SHX303

## 2020-03-03 SURGERY — EGD (ESOPHAGOGASTRODUODENOSCOPY)
Anesthesia: Moderate Sedation

## 2020-03-03 MED ORDER — LIDOCAINE VISCOUS HCL 2 % MT SOLN
OROMUCOSAL | Status: DC | PRN
  Administered 2020-03-03: 1 via OROMUCOSAL

## 2020-03-03 MED ORDER — MEPERIDINE HCL 50 MG/ML IJ SOLN
INTRAMUSCULAR | Status: AC
  Filled 2020-03-03: qty 1

## 2020-03-03 MED ORDER — MIDAZOLAM HCL 5 MG/5ML IJ SOLN
INTRAMUSCULAR | Status: AC
  Filled 2020-03-03: qty 10

## 2020-03-03 MED ORDER — PANTOPRAZOLE SODIUM 40 MG PO TBEC: 40.0000 mg | DELAYED_RELEASE_TABLET | Freq: Every day | ORAL | 3 refills | Status: DC

## 2020-03-03 MED ORDER — SODIUM CHLORIDE 0.9 % IV SOLN: INTRAVENOUS | Status: DC

## 2020-03-03 MED ORDER — LIDOCAINE VISCOUS HCL 2 % MT SOLN
OROMUCOSAL | Status: AC
  Filled 2020-03-03: qty 15

## 2020-03-03 MED ORDER — MEPERIDINE HCL 50 MG/ML IJ SOLN
INTRAMUSCULAR | Status: DC | PRN
  Administered 2020-03-03 (×2): 25 mg

## 2020-03-03 MED ORDER — MIDAZOLAM HCL 5 MG/5ML IJ SOLN
INTRAMUSCULAR | Status: DC | PRN
  Administered 2020-03-03: 1 mg via INTRAVENOUS
  Administered 2020-03-03 (×2): 2 mg via INTRAVENOUS
  Administered 2020-03-03: 1 mg via INTRAVENOUS

## 2020-03-03 NOTE — Discharge Instructions (Signed)
No aspirin or NSAIDs for 24 hours. Discontinue Pentasa but resume other medications as before. Pantoprazole 40 mg by mouth 30 minutes before breakfast daily. Resume usual diet. No driving for 24 hours. Please call office with progress report in 1 week. Office visit in 3 months regarding GERD.   Upper Endoscopy, Adult, Care After This sheet gives you information about how to care for yourself after your procedure. Your health care provider may also give you more specific instructions. If you have problems or questions, contact your health care provider.  Dr. Laural Golden:  [phone] What can I expect after the procedure? After the procedure, it is common to have:  A sore throat.  Mild stomach pain or discomfort.  Bloating.  Nausea. Follow these instructions at home:   Follow instructions from your health care provider about what to eat or drink after your procedure.  Take over-the-counter and prescription medicines only as told by your health care provider.  Do not drive for 24 hours if you were given a sedative during your procedure.  Keep all follow-up visits as told by your health care provider. This is important. Contact a health care provider if you have:  A sore throat that lasts longer than one day.  Trouble swallowing. Get help right away if:  You vomit blood or your vomit looks like coffee grounds.  You have: ? A fever. ? Bloody, black, or tarry stools. ? A severe sore throat or you cannot swallow. ? Difficulty breathing. ? Severe pain in your chest or abdomen. Summary  After the procedure, it is common to have a sore throat, mild stomach discomfort, bloating, and nausea.  Do not drive for 24 hours if you were given a sedative during the procedure.  Follow instructions from your health care provider about what to eat or drink after your procedure.  Return to your normal activities as told by your health care provider. This information is not intended to  replace advice given to you by your health care provider. Make sure you discuss any questions you have with your health care provider. Document Revised: 12/31/2017 Document Reviewed: 12/09/2017 Elsevier Patient Education  [place].

## 2020-03-03 NOTE — H&P (Addendum)
Kristen Cooper is an 54 y.o. female.   Chief Complaint: Patient is here for esophagogastroduodenoscopy and esophageal dilation HPI: Patient is 54 year old Caucasian female who has history of ileocolonic Crohn's disease maintained on infliximab and oral mesalamine and deemed to be in remission who presents with several month history of intermittent dysphagia to solids.  Kristen Cooper has most difficulty when Kristen Cooper is eating hamburgers.  Kristen Cooper has no difficulty with clear liquids.  Kristen Cooper points to mid sternal area site of bolus obstruction.  Kristen Cooper has occasional heartburn.  Kristen Cooper says Kristen Cooper has lost weight voluntarily because Kristen Cooper quit eating sugars.  Kristen Cooper is happy with her current weight.  Kristen Cooper denies nausea vomiting epigastric pain or melena. Kristen Cooper is on methotrexate for polymyositis.  Past Medical History:  Diagnosis Date   Crohn disease [facility]) GI-- dr setzer/ dr Laural Golden:   rheumologist-  dr Amil Amen   dx 1987   Dermatography 08/2012   To many histamine cells   Hypertension    Inflammatory bowel disease         Polymyositis.  Past Surgical History:  Procedure Laterality Date   APPENDECTOMY  1987   COLONOSCOPY N/A 10/27/2015   Procedure: COLONOSCOPY;  Surgeon: Rogene Houston, MD;  [facility];  Service: Endoscopy;  Laterality: N/A;  11:00   MUSCLE BIOPSY Right 01/02/2017   Procedure: UPPER RIGHT  EXTREMITY MUSCLE BIOPSY;  Surgeon: Ralene Ok, MD;  Location: [place];  Service: General;  Laterality: Right;   UPPER GASTROINTESTINAL ENDOSCOPY  ?    Family History  Problem Relation Age of Onset   Crohn's disease Mother    Irritable bowel syndrome Father    Hypertension Brother    Healthy Brother    Healthy Brother    Social History:  reports that Kristen Cooper has never smoked. Kristen Cooper has never used smokeless tobacco. Kristen Cooper reports that Kristen Cooper does not drink alcohol and does not use drugs.  Allergies:  Allergies  Allergen Reactions   Oatmeal Hives and Itching    Medications Prior to  Admission  Medication Sig Dispense Refill   acetaminophen (TYLENOL) 650 MG CR tablet Take 1,300 mg by mouth 2 (two) times daily as needed for pain.     cetirizine (ZYRTEC) 10 MG tablet Take 10 mg by mouth at bedtime.      fexofenadine (ALLEGRA) 180 MG tablet Take 180 mg by mouth daily.      folic acid (FOLVITE) 767 MCG tablet Take 1,600 mcg by mouth daily.      lisinopril-hydrochlorothiazide (PRINZIDE,ZESTORETIC) 10-12.5 MG per tablet Take 0.5 tablets by mouth daily.      Methotrexate, PF, 10 MG/0.4ML SOAJ Inject 0.4 mLs into the skin once a week.     Olopatadine HCl (PATADAY) 0.7 % SOLN Place 1 drop into both eyes daily.     PENTASA 500 MG CR capsule TAKE (2) CAPSULES FOUR TIMES DAILY. (Patient taking differently: Take 1,000 mg by mouth 4 (four) times daily. ) 240 capsule 5   Probiotic Product (PROBIOTIC DAILY PO) Take 1 tablet by mouth daily.      Propylene Glycol (SYSTANE BALANCE) 0.6 % SOLN Place 1 application into both eyes daily as needed (Dry eyes).     inFLIXimab (REMICADE IV) Inject 5 mg into the vein See admin instructions. IV once every 8 weeks.       No results found for this or any previous visit (from the past 48 hour(s)). No results found.  Review of Systems  Blood pressure 120/68, pulse 97,  temperature 98.1 F (36.7 C), temperature source Oral, resp. rate 15, height 5' 3"  (1.6 m), weight 46.7 kg, last menstrual period 06/03/2016, SpO2 97 %. Physical Exam HENT:     Mouth/Throat:     Mouth: Mucous membranes are moist.     Pharynx: Oropharynx is clear.  Eyes:     General: No scleral icterus.    Conjunctiva/sclera: Conjunctivae normal.  Cardiovascular:     Rate and Rhythm: Normal rate and regular rhythm.     Heart sounds: Normal heart sounds. No murmur heard.   Pulmonary:     Effort: Pulmonary effort is normal.     Breath sounds: Normal breath sounds.  Abdominal:     General: Abdomen is flat.     Comments: Abdomen is soft.  Is fullness in right lower  quadrant mild tenderness.  No organomegaly or masses.  Musculoskeletal:        General: No swelling.     Cervical back: Neck supple.  Lymphadenopathy:     Cervical: No cervical adenopathy.  Skin:    General: Skin is warm and dry.  Neurological:     Mental Status: Kristen Cooper is alert.     Assessment/Plan Esophageal dysphagia in a patient with history of Crohn's disease as well as polymyositis. Esophagogastroduodenoscopy with esophageal dilation.  Hildred Laser, MD 03/03/2020, 12:24 PM

## 2020-03-03 NOTE — Op Note (Signed)
[facility] Patient Name: Kristen Cooper Procedure Date: 03/03/2020 12:24 PM MRN: [id number] Date of Birth: 02/03/1966 Attending MD: Hildred Laser , MD CSN: [id number] Age: 54 Admit Type: Outpatient Procedure:                Upper GI endoscopy Indications:              Esophageal dysphagia Providers:                Hildred Laser, MD, Janeece Riggers, RN, Lambert Mody, Randa Spike, Technician Referring MD:             Kassie Mends, PA Medicines:                Lidocaine spray, Meperidine 50 mg IV, Midazolam 6                            mg IV Complications:            No immediate complications. Estimated Blood Loss:     Estimated blood loss was minimal. Procedure:                Pre-Anesthesia Assessment:                           - Prior to the procedure, a History and Physical                            was performed, and patient medications and                            allergies were reviewed. The patient's tolerance of                            previous anesthesia was also reviewed. The risks                            and benefits of the procedure and the sedation                            options and risks were discussed with the patient.                            All questions were answered, and informed consent                            was obtained. Prior Anticoagulants: The patient has                            taken no previous anticoagulant or antiplatelet                            agents. ASA Grade Assessment: III - A patient with  severe systemic disease. After reviewing the risks                            and benefits, the patient was deemed in                            satisfactory condition to undergo the procedure.                           After obtaining informed consent, the endoscope was                            passed under direct vision. Throughout the                            procedure, the  patient's blood pressure, pulse, and                            oxygen saturations were monitored continuously. The                            [id number]-[id number] ([id number]) scope was introduced through the                            mouth, and advanced to the second part of duodenum.                            The upper GI endoscopy was accomplished without                            difficulty. The patient tolerated the procedure                            well. Scope In: 12:42:46 PM Scope Out: 12:53:17 PM Total Procedure Duration: 0 hours 10 minutes 31 seconds  Findings:      The hypopharynx was normal.      The examined esophagus was normal.      LA Grade A (one or more mucosal breaks less than 5 mm, not extending       between tops of 2 mucosal folds) esophagitis with no bleeding was found       35 cm from the incisors.      A widely patent Schatzki ring was found at the gastroesophageal junction.      No endoscopic abnormality was evident in the esophagus to explain the       patient's complaint of dysphagia. It was decided, however, to proceed       with dilation of the entire esophagus. The scope was withdrawn. Dilation       was performed with a Maloney dilator with no resistance at 74 Fr. The       dilation site was examined following endoscope reinsertion and showed       mild mucosal disruption at proximal esophagus and no perforation.      The entire examined stomach was normal.      The duodenal bulb and second portion of the duodenum were normal.  Impression:               - Normal hypopharynx.                           - Normal esophagus.                           - LA Grade A reflux esophagitis with no bleeding.                           - Widely patent Schatzki ring.                           - No endoscopic esophageal abnormality to explain                            patient's dysphagia. Esophagus dilated resulting in                            small mucosal disruption at  proximal esophagus                            suggestive of a web.                           - Normal stomach.                           - Normal duodenal bulb and second portion of the                            duodenum.                           - No specimens collected. Moderate Sedation:      Moderate (conscious) sedation was administered by the endoscopy nurse       and supervised by the endoscopist. The following parameters were       monitored: oxygen saturation, heart rate, blood pressure, CO2       capnography and response to care. Total physician intraservice time was       15 minutes. Recommendation:           - Patient has a contact number available for                            emergencies. The signs and symptoms of potential                            delayed complications were discussed with the                            patient. Return to normal activities tomorrow.                            Written discharge instructions were provided to the  patient.                           - Resume previous diet today.                           - Continue present medications.                           - Use Protonix (pantoprazole) 40 mg PO daily.                           - Return to GI clinic in 3 months. Procedure Code(s):        --- Professional ---                           [phone], Esophagogastroduodenoscopy, flexible,                            transoral; diagnostic, including collection of                            specimen(s) by brushing or washing, when performed                            (separate procedure)                           43450, Dilation of esophagus, by unguided sound or                            bougie, single or multiple passes                           G0500, Moderate sedation services provided by the                            same physician or other qualified health care                            professional performing a  gastrointestinal                            endoscopic service that sedation supports,                            requiring the presence of an independent trained                            observer to assist in the monitoring of the                            patient's level of consciousness and physiological                            status; initial 15 minutes of intra-service time;  patient age 39 years or older (additional time may                            be reported with [id number], as appropriate) Diagnosis Code(s):        --- Professional ---                           K21.00, Gastro-esophageal reflux disease with                            esophagitis, without bleeding                           K22.2, Esophageal obstruction                           R13.14, Dysphagia, pharyngoesophageal phase CPT copyright 2019 [employer]. All rights reserved. The codes documented in this report are preliminary and upon coder review may  be revised to meet current compliance requirements. Hildred Laser, MD Hildred Laser, MD 03/03/2020 1:07:17 PM This report has been signed electronically. Number of Addenda: 0

## 2020-03-08 ENCOUNTER — Encounter (HOSPITAL_COMMUNITY): Payer: Self-pay | Admitting: Internal Medicine

## 2020-05-08 ENCOUNTER — Other Ambulatory Visit: Payer: Self-pay

## 2020-05-08 ENCOUNTER — Ambulatory Visit
Admission: EM | Admit: 2020-05-08 | Discharge: 2020-05-08 | Disposition: A | Discharge status: Home / Self Care | Payer: BC Managed Care – PPO | Attending: Emergency Medicine | Admitting: Emergency Medicine

## 2020-05-08 DIAGNOSIS — R350 Frequency of micturition: Secondary | ICD-10-CM | POA: Diagnosis present

## 2020-05-08 DIAGNOSIS — N3001 Acute cystitis with hematuria: Secondary | ICD-10-CM | POA: Diagnosis present

## 2020-05-08 LAB — POCT URINALYSIS DIP (MANUAL ENTRY)
Bilirubin, UA: NEGATIVE
Glucose, UA: NEGATIVE mg/dL
Nitrite, UA: NEGATIVE
Protein Ur, POC: NEGATIVE mg/dL
Spec Grav, UA: >=1.03 — AB (ref 1.010–1.025)
Urobilinogen, UA: 0.2 E.U./dL
pH, UA: 6 (ref 5.0–8.0)

## 2020-05-08 MED ORDER — NITROFURANTOIN MONOHYD MACRO 100 MG PO CAPS
100.0000 mg | ORAL_CAPSULE | Freq: Two times a day (BID) | ORAL | 0 refills | Status: DC
Start: 2020-05-08 — End: 2020-06-06

## 2020-05-08 NOTE — ED Provider Notes (Signed)
[facility]   CC: Urinary frequency  SUBJECTIVE:  Kristen Cooper is a 54 y.o. female who complains of urinary frequency x 3 days, worse over the past day.  Patient denies a precipitating event, recent sexual encounter, excessive caffeine intake.  Denies abdominal or flank pain.  Has NOT tried OTC medications.  Symptoms are made worse with urination.  Admits to similar symptoms in the past.  Denies fever, chills, nausea, vomiting, abdominal pain, flank pain, abnormal vaginal discharge or bleeding, hematuria.    LMP: Patient's last menstrual period was 06/03/2016 (approximate).  ROS: As in HPI.  All other pertinent ROS negative.     Past Medical History:  Diagnosis Date   Crohn disease [facility]) GI-- dr setzer/ dr Laural Golden:   rheumologist-  dr Amil Amen   dx 1987   Dermatography 08/2012   To many histamine cells   Hypertension    Inflammatory bowel disease    Past Surgical History:  Procedure Laterality Date   APPENDECTOMY  1987   COLONOSCOPY N/A 10/27/2015   Procedure: COLONOSCOPY;  Surgeon: Rogene Houston, MD;  [facility];  Service: Endoscopy;  Laterality: N/A;  11:00   ESOPHAGEAL DILATION N/A 03/03/2020   Procedure: ESOPHAGEAL DILATION;  Surgeon: Rogene Houston, MD;  [facility];  Service: Endoscopy;  Laterality: N/A;   ESOPHAGOGASTRODUODENOSCOPY N/A 03/03/2020   Procedure: ESOPHAGOGASTRODUODENOSCOPY (EGD);  Surgeon: Rogene Houston, MD;  [facility];  Service: Endoscopy;  Laterality: N/A;  1155   MUSCLE BIOPSY Right 01/02/2017   Procedure: UPPER RIGHT  EXTREMITY MUSCLE BIOPSY;  Surgeon: Ralene Ok, MD;  Location: [place];  Service: General;  Laterality: Right;   UPPER GASTROINTESTINAL ENDOSCOPY  ?   Allergies  Allergen Reactions   Oatmeal Hives and Itching   No current facility-administered medications on file prior to encounter.   Current Outpatient Medications on File Prior to Encounter  Medication  Sig Dispense Refill   acetaminophen (TYLENOL) 650 MG CR tablet Take 1,300 mg by mouth 2 (two) times daily as needed for pain.     cetirizine (ZYRTEC) 10 MG tablet Take 10 mg by mouth at bedtime.      fexofenadine (ALLEGRA) 180 MG tablet Take 180 mg by mouth daily.      folic acid (FOLVITE) 268 MCG tablet Take 1,600 mcg by mouth daily.      inFLIXimab (REMICADE IV) Inject 5 mg into the vein See admin instructions. IV once every 8 weeks.      lisinopril-hydrochlorothiazide (PRINZIDE,ZESTORETIC) 10-12.5 MG per tablet Take 0.5 tablets by mouth daily.      Methotrexate, PF, 10 MG/0.4ML SOAJ Inject 0.4 mLs into the skin once a week.     Olopatadine HCl (PATADAY) 0.7 % SOLN Place 1 drop into both eyes daily.     Probiotic Product (PROBIOTIC DAILY PO) Take 1 tablet by mouth daily.      Propylene Glycol (SYSTANE BALANCE) 0.6 % SOLN Place 1 application into both eyes daily as needed (Dry eyes).     [DISCONTINUED] pantoprazole (PROTONIX) 40 MG tablet Take 1 tablet (40 mg total) by mouth daily before breakfast. 30 tablet 3   Social History   Socioeconomic History   Marital status: Married    Spouse name: Not on file   Number of children: Not on file   Years of education: Not on file   Highest education level: Not on file  Occupational History   Not on file  Tobacco Use  Smoking status: Never Smoker   Smokeless tobacco: Never Used  Vaping Use   Vaping Use: Never used  Substance and Sexual Activity   Alcohol use: No    Alcohol/week: 0.0 standard drinks   Drug use: No   Sexual activity: Not on file  Other Topics Concern   Not on file  Social History Narrative   Not on file   Social Determinants of Health   Financial Resource Strain:    Difficulty of Paying Living Expenses: Not on file  Food Insecurity:    Worried About Rew in the Last Year: Not on file   Ran Out of Food in the Last Year: Not on file  Transportation Needs:    Lack of  Transportation (Medical): Not on file   Lack of Transportation (Non-Medical): Not on file  Physical Activity:    Days of Exercise per Week: Not on file   Minutes of Exercise per Session: Not on file  Stress:    Feeling of Stress : Not on file  Social Connections:    Frequency of Communication with Friends and Family: Not on file   Frequency of Social Gatherings with Friends and Family: Not on file   Attends Religious Services: Not on file   Active Member of Clubs or Organizations: Not on file   Attends Archivist Meetings: Not on file   Marital Status: Not on file  Intimate Partner Violence:    Fear of Current or Ex-Partner: Not on file   Emotionally Abused: Not on file   Physically Abused: Not on file   Sexually Abused: Not on file   Family History  Problem Relation Age of Onset   Crohn's disease Mother    Irritable bowel syndrome Father    Hypertension Brother    Healthy Brother    Healthy Brother     OBJECTIVE:  Vitals:   05/08/20 1544  BP: 120/81  Pulse: (!) 108  Resp: 18  Temp: 98.2 F (36.8 C)  SpO2: 96%   General appearance: AO in no acute distress HEENT: NCAT.  Oropharynx clear.  Lungs: clear to auscultation bilaterally without adventitious breath sounds Heart: regular rate and rhythm.   Abdomen: soft; non-distended; no tenderness; bowel sounds present; no guarding Extremities: no edema; symmetrical with no gross deformities Skin: warm and dry Neurologic: Ambulates from chair to exam table without difficulty Psychological: alert and cooperative; normal mood and affect  Labs Reviewed  POCT URINALYSIS DIP (MANUAL ENTRY) - Abnormal; Notable for the following components:      Result Value   Ketones, POC UA small (15) (*)    Spec Grav, UA >=1.030 (*)    Blood, UA moderate (*)    Leukocytes, UA Trace (*)    All other components within normal limits  URINE CULTURE    ASSESSMENT & PLAN:  1. Urinary frequency   2. Acute  cystitis with hematuria     Meds ordered this encounter  Medications   nitrofurantoin, macrocrystal-monohydrate, (MACROBID) 100 MG capsule    Sig: Take 1 capsule (100 mg total) by mouth 2 (two) times daily.    Dispense:  10 capsule    Refill:  0    Order Specific Question:   Supervising Provider    Answer:   Raylene Everts [[id number]]   Urine concerning for UTI Urine culture sent.  We will call you with the results.   Push fluids and get plenty of rest.   Antibiotics prescribed Follow up with  PCP if symptoms persists Return here or go to ER if you have any new or worsening symptoms such as fever, worsening abdominal pain, nausea/vomiting, flank pain, etc...  Outlined signs and symptoms indicating need for more acute intervention. Patient verbalized understanding. After Visit Summary given.     Lestine Box, PA-C 05/08/20 1553

## 2020-05-08 NOTE — Discharge Instructions (Addendum)
Urine concerning for UTI Urine culture sent.  We will call you with the results.   Push fluids and get plenty of rest.   Antibiotics prescribed Follow up with PCP if symptoms persists Return here or go to ER if you have any new or worsening symptoms such as fever, worsening abdominal pain, nausea/vomiting, flank pain, etc..Marland Kitchen

## 2020-05-08 NOTE — ED Triage Notes (Signed)
Pt presents with c/o urinary frequency and pressure that first began Thursday became worse today

## 2020-05-11 LAB — URINE CULTURE: Culture: NO GROWTH

## 2020-05-31 ENCOUNTER — Ambulatory Visit (INDEPENDENT_AMBULATORY_CARE_PROVIDER_SITE_OTHER): Payer: BC Managed Care – PPO | Admitting: Gastroenterology

## 2020-06-02 ENCOUNTER — Ambulatory Visit (INDEPENDENT_AMBULATORY_CARE_PROVIDER_SITE_OTHER): Payer: BC Managed Care – PPO | Admitting: Gastroenterology

## 2020-06-06 ENCOUNTER — Ambulatory Visit (INDEPENDENT_AMBULATORY_CARE_PROVIDER_SITE_OTHER): Payer: BC Managed Care – PPO | Admitting: Gastroenterology

## 2020-06-06 ENCOUNTER — Other Ambulatory Visit: Payer: Self-pay

## 2020-06-06 ENCOUNTER — Encounter (INDEPENDENT_AMBULATORY_CARE_PROVIDER_SITE_OTHER): Payer: Self-pay | Admitting: Gastroenterology

## 2020-06-06 VITALS — BP 108/72 | HR 98 | Temp 97.8°F | Ht 63.0 in | Wt 105.0 lb

## 2020-06-06 DIAGNOSIS — K50919 Crohn's disease, unspecified, with unspecified complications: Secondary | ICD-10-CM

## 2020-06-06 DIAGNOSIS — R1310 Dysphagia, unspecified: Secondary | ICD-10-CM | POA: Diagnosis not present

## 2020-06-06 NOTE — Progress Notes (Signed)
Patient profile: Kristen Cooper is a 54 y.o. female seen for follow-up-last seen July 2021 for dysphagia and had upper endoscopy August 2021 as below.  Also has past medical history of ileocolonic Crohn's disease dx in 1987 maintained on Remicade and Pentasa.  Also on methotrexate for polymyositis.  History of Present Illness: Kristen Cooper is seen today for follow up.  She reports having improvement in her dysphagia since her upper endoscopy.  She has had 2 episodes of dysphagia to bread in the past 3 months but this is drastically better than her dysphagia prior to dilation which included foods and pill dysphagia frequently.  She denies feeling any acid reflux or indigestion symptoms.  No epigastric pain.  Denies nausea vomiting.  She was on Protonix for about 1 month but feels that the Protonix caused issues with constipation.  She stopped Protonix and constipation resolved.  She is now moving her bowels fairly regularly.  Denies rectal bleeding or melena.  No diarrhea.  She denies NSAIDs tobacco and alcohol.  Wt Readings from Last 3 Encounters:  06/06/20 105 lb (47.6 kg)  03/03/20 103 lb (46.7 kg)  01/07/20 105 lb 4.8 oz (47.8 kg)     Last Colonoscopy: 2017-Erosions in the terminal ileum, erythematous eroded ulcerated mucosa in sigmoid, descending, transverse, ascending, cecum.  6 mm polyp sigmoid.  External hemorrhoids.  Polyp was tubular adenoma.  5-year repeat recommended.     Last Endoscopy: 02/2020-- Normal hypopharynx. - Normal esophagus. - LA Grade A reflux esophagitis with no bleeding. - Widely patent Schatzki ring. - No endoscopic esophageal abnormality to explain patient's dysphagia. Esophagus dilated resulting in small mucosal disruption at proximal esophagus suggestive of a web. - Normal stomach. - Normal duodenal bulb and second portion of the duodenum. - No specimens collected.   Past Medical History:  Past Medical History:  Diagnosis Date  . Crohn disease [facility])  GI-- dr setzer/ dr Laural Golden:   rheumologist-  dr Amil Amen   dx [phone]  . Dermatography 08/2012   To many histamine cells  . Hypertension   . Inflammatory bowel disease     Problem List: Patient Active Problem List   Diagnosis Date Noted  . Polymyositis ([place]) 03/03/2020  . Crohn disease ([place]) 09/21/2013    Past Surgical History: Past Surgical History:  Procedure Laterality Date  . APPENDECTOMY  1987  . COLONOSCOPY N/A 10/27/2015   Procedure: COLONOSCOPY;  Surgeon: Rogene Houston, MD;  [facility];  Service: Endoscopy;  Laterality: N/A;  11:00  . ESOPHAGEAL DILATION N/A 03/03/2020   Procedure: ESOPHAGEAL DILATION;  Surgeon: Rogene Houston, MD;  [facility];  Service: Endoscopy;  Laterality: N/A;  . ESOPHAGOGASTRODUODENOSCOPY N/A 03/03/2020   Procedure: ESOPHAGOGASTRODUODENOSCOPY (EGD);  Surgeon: Rogene Houston, MD;  [facility];  Service: Endoscopy;  Laterality: N/A;  1155  . MUSCLE BIOPSY Right 01/02/2017   Procedure: UPPER RIGHT  EXTREMITY MUSCLE BIOPSY;  Surgeon: Ralene Ok, MD;  Location: [place];  Service: General;  Laterality: Right;  . UPPER GASTROINTESTINAL ENDOSCOPY  ?    Allergies: Allergies  Allergen Reactions  . Oatmeal Hives and Itching      Home Medications:  Current Outpatient Medications:  .  acetaminophen (TYLENOL) 650 MG CR tablet, Take 1,300 mg by mouth 2 (two) times daily as needed for pain., Disp: , Rfl:  .  cetirizine (ZYRTEC) 10 MG tablet, Take 10 mg by mouth at bedtime. , Disp: , Rfl:  .  fexofenadine (ALLEGRA) 180 MG tablet, Take 180 mg by mouth daily. , Disp: , Rfl:  .  folic acid (FOLVITE) 001 MCG tablet, Take 1,600 mcg by mouth daily. , Disp: , Rfl:  .  inFLIXimab (REMICADE IV), Inject 5 mg into the vein See admin instructions. IV once every 8 weeks. , Disp: , Rfl:  .  lisinopril-hydrochlorothiazide (PRINZIDE,ZESTORETIC) 10-12.5 MG per tablet, Take 0.5 tablets by mouth daily. , Disp: , Rfl:  .   Methotrexate, PF, 10 MG/0.4ML SOAJ, Inject 0.4 mLs into the skin once a week., Disp: , Rfl:  .  Olopatadine HCl (PATADAY) 0.7 % SOLN, Place 1 drop into both eyes daily., Disp: , Rfl:  .  oxybutynin (DITROPAN-XL) 10 MG 24 hr tablet, Take 10 mg by mouth at bedtime., Disp: , Rfl:  .  Probiotic Product (PROBIOTIC DAILY PO), Take 1 tablet by mouth daily. , Disp: , Rfl:  .  Propylene Glycol (SYSTANE BALANCE) 0.6 % SOLN, Place 1 application into both eyes daily as needed (Dry eyes)., Disp: , Rfl:    Family History: family history includes Crohn's disease in her mother; Healthy in her brother and brother; Hypertension in her brother; Irritable bowel syndrome in her father.    Social History:   reports that she has never smoked. She has never used smokeless tobacco. She reports that she does not drink alcohol and does not use drugs.   Review of Systems: Constitutional: Denies weight loss/weight gain  Eyes: No changes in vision. ENT: No oral lesions, sore throat.  GI: see HPI.  Heme/Lymph: No easy bruising.  CV: No chest pain.  GU: No hematuria.  Integumentary: No rashes.  Neuro: No headaches.  Psych: No depression/anxiety.  Endocrine: No heat/cold intolerance.  Allergic/Immunologic: No urticaria.  Resp: No cough, SOB.  Musculoskeletal: No joint swelling.    Physical Examination: BP 108/72 (BP Location: Right Arm, Patient Position: Sitting, Cuff Size: Small)   Pulse 98   Temp 97.8 F (36.6 C) (Oral)   Ht 5' 3"  (1.6 m)   Wt 105 lb (47.6 kg)   LMP 06/03/2016 (Approximate)   BMI 18.60 kg/m  Gen: NAD, alert and oriented x 4 HEENT: PEERLA, EOMI, Neck: supple, no JVD Chest: CTA bilaterally, no wheezes, crackles, or other adventitious sounds CV: RRR, no m/g/c/r Abd: soft, NT, ND, +BS in all four quadrants; no HSM, guarding, ridigity, or rebound tenderness Ext: no edema, well perfused with 2+ pulses, Skin: no rash or lesions noted on observed skin Lymph: no noted LAD  Data  Reviewed:     Assessment/Plan: Kristen Cooper is a 54 y.o. female seen for follow  1.  Dysphagia/GERD-improved after dilation.  Was on Protonix but feels this caused constipation.  Not feeling frequent GERD, etc. consider alternative PPI at low-dose if symptoms return.  She also will take Pepcid as needed for her GERD symptoms.  She has an adjustable bed and remains upright after meals.  Diet modifications reviewed  2.  Crohn's-clinically in remission maintained on Remicade and Pentasa through [facility] rheumatology.  Reports she is up-to-date with labs which are drawn at each infusion.  To notify us with any symptoms  F/up 1 year - sooner if needed   Blondine was seen today for follow-up.  Diagnoses and all orders for this visit:  Dysphagia, unspecified type  Crohn's disease with complication, unspecified gastrointestinal tract location [facility])    I personally performed the service, non-incident to. (WP)  Laurine Blazer, [facility]

## 2020-06-06 NOTE — Patient Instructions (Signed)
GERD instructions: -Please avoid lying flat within 2 to 3 hours of eating, this will make reflux symptoms worse. -Some patients find elevating the head of the bed beneficial. -Avoid spicy greasy foods as well as caffeine, coffee, sodas-these food/drinks can worsen heartburn and reflux. -Tobacco will worsen reflux, please try to decrease/eliminate tobacco intake if applicable. -Avoid NSAID products (ibuprofen, aspirin, Advil, Aleve, Goody's, BCs, Alka-Seltzer) - if needing these occasionally please try to take with meal or snack to decrease stomach irritation. -You can try pepcid as needed.    Follow up one year - sooner if needed

## 2020-06-09 ENCOUNTER — Ambulatory Visit (INDEPENDENT_AMBULATORY_CARE_PROVIDER_SITE_OTHER): Payer: BC Managed Care – PPO | Admitting: Gastroenterology

## 2020-11-10 ENCOUNTER — Ambulatory Visit (INDEPENDENT_AMBULATORY_CARE_PROVIDER_SITE_OTHER): Payer: BC Managed Care – PPO | Admitting: Urology

## 2020-11-10 ENCOUNTER — Encounter: Payer: Self-pay | Admitting: Urology

## 2020-11-10 ENCOUNTER — Other Ambulatory Visit: Payer: Self-pay

## 2020-11-10 VITALS — BP 104/67 | HR 79 | Temp 98.8°F | Ht 63.0 in | Wt 105.0 lb

## 2020-11-10 DIAGNOSIS — N3281 Overactive bladder: Secondary | ICD-10-CM

## 2020-11-10 DIAGNOSIS — R3129 Other microscopic hematuria: Secondary | ICD-10-CM

## 2020-11-10 DIAGNOSIS — N952 Postmenopausal atrophic vaginitis: Secondary | ICD-10-CM

## 2020-11-10 DIAGNOSIS — R339 Retention of urine, unspecified: Secondary | ICD-10-CM

## 2020-11-10 LAB — MICROSCOPIC EXAMINATION: Renal Epithel, UA: NONE SEEN /hpf

## 2020-11-10 LAB — URINALYSIS, ROUTINE W REFLEX MICROSCOPIC
Bilirubin, UA: NEGATIVE
Glucose, UA: NEGATIVE
Ketones, UA: NEGATIVE
Leukocytes,UA: NEGATIVE
Nitrite, UA: NEGATIVE
Protein,UA: NEGATIVE
Specific Gravity, UA: 1.025 (ref 1.005–1.030)
Urobilinogen, Ur: 0.2 mg/dL (ref 0.2–1.0)
pH, UA: 5.5 (ref 5.0–7.5)

## 2020-11-10 LAB — BLADDER SCAN AMB NON-IMAGING: Scan Result: 0

## 2020-11-10 MED ORDER — PREMARIN 0.625 MG/GM VA CREA
TOPICAL_CREAM | VAGINAL | 12 refills | Status: DC
Start: 2020-11-10 — End: 2020-11-17

## 2020-11-10 NOTE — Progress Notes (Signed)
Subjective: 1. Incomplete bladder emptying   2. Microhematuria   3. Vaginal atrophy   4. Overactive bladder      Consult requested by Dr. Consuello Masse.  Kristen Cooper is a 55 yo female who is sent for evaluation of a sensation of incomplete emptying.  She has frequency and urgency.  She has nocturia x 2 on therapy.  She has some hesitancy.  She has an intermittently slow stream.  She does some postural voiding and crede and can occasionally get more out.  She has no incontinence.  She has no prolapse symptoms.  She has had no gross hematuria but has blood on her dip UA's and has 3-10 RBC's today.  She is G0P0.   She has a history of OAB wet and is on oxybutynin ER 33m daily.   She had a urethral dilation remotely which helped her symptoms.   She has Crohn's disease.  She saw Dr. JMichela Pitcherin the past.   ROS:  ROS  Allergies  Allergen Reactions  . Other Other (See Comments)  . Oatmeal Hives and Itching    Past Medical History:  Diagnosis Date  . Crohn disease ([facility] GI-- dr setzer/ dr rLaural Golden   rheumologist-  dr bAmil Amen  dx [phone] . Dermatography 08/2012   To many histamine cells  . Hypertension   . Inflammatory bowel disease     Past Surgical History:  Procedure Laterality Date  . APPENDECTOMY  1987  . COLONOSCOPY N/A 10/27/2015   Procedure: COLONOSCOPY;  Surgeon: NRogene Houston MD;  [facility];  Service: Endoscopy;  Laterality: N/A;  11:00  . ESOPHAGEAL DILATION N/A 03/03/2020   Procedure: ESOPHAGEAL DILATION;  Surgeon: RRogene Houston MD;  [facility];  Service: Endoscopy;  Laterality: N/A;  . ESOPHAGOGASTRODUODENOSCOPY N/A 03/03/2020   Procedure: ESOPHAGOGASTRODUODENOSCOPY (EGD);  Surgeon: RRogene Houston MD;  [facility];  Service: Endoscopy;  Laterality: N/A;  1155  . MUSCLE BIOPSY Right 01/02/2017   Procedure: UPPER RIGHT  EXTREMITY MUSCLE BIOPSY;  Surgeon: RRalene Ok MD;  Location: [place]  Service: General;   Laterality: Right;  . UPPER GASTROINTESTINAL ENDOSCOPY  ?    Social History   Socioeconomic History  . Marital status: Married    Spouse name: Not on file  . Number of children: Not on file  . Years of education: Not on file  . Highest education level: Not on file  Occupational History  . Not on file  Tobacco Use  . Smoking status: Never Smoker  . Smokeless tobacco: Never Used  Vaping Use  . Vaping Use: Never used  Substance and Sexual Activity  . Alcohol use: No    Alcohol/week: 0.0 standard drinks  . Drug use: No  . Sexual activity: Not on file  Other Topics Concern  . Not on file  Social History Narrative  . Not on file   Social Determinants of Health   Financial Resource Strain: Not on file  Food Insecurity: Not on file  Transportation Needs: Not on file  Physical Activity: Not on file  Stress: Not on file  Social Connections: Not on file  Intimate Partner Violence: Not on file    Family History  Problem Relation Age of Onset  . Crohn's disease Mother   . Irritable bowel syndrome Father   . Hypertension Brother   . Healthy Brother   . Healthy Brother     Anti-infectives: Anti-infectives (From admission, onward)   None  Current Outpatient Medications  Medication Sig Dispense Refill  . cetirizine (ZYRTEC) 10 MG tablet Take 10 mg by mouth at bedtime.     . conjugated estrogens (PREMARIN) vaginal cream Apply 0.5 gm (pea sized amount) using the tip of the finger nightly for 2 weeks then 2-3x weekly. 42.5 g 12  . fexofenadine (ALLEGRA) 180 MG tablet Take 180 mg by mouth daily.     . folic acid (FOLVITE) 355 MCG tablet Take 1,600 mcg by mouth daily.     Marland Kitchen inFLIXimab (REMICADE IV) Inject 5 mg into the vein See admin instructions. IV once every 8 weeks.    Marland Kitchen lisinopril-hydrochlorothiazide (PRINZIDE,ZESTORETIC) 10-12.5 MG per tablet Take 0.5 tablets by mouth daily.     . Methotrexate, PF, 10 MG/0.4ML SOAJ Inject 0.4 mLs into the skin once a week.    .  Olopatadine HCl (PATADAY) 0.7 % SOLN Place 1 drop into both eyes daily.    Marland Kitchen oxybutynin (DITROPAN-XL) 10 MG 24 hr tablet Take 10 mg by mouth at bedtime.    Marland Kitchen Propylene Glycol (SYSTANE BALANCE) 0.6 % SOLN Place 1 application into both eyes daily as needed (Dry eyes).    Marland Kitchen acetaminophen (TYLENOL) 650 MG CR tablet Take 1,300 mg by mouth 2 (two) times daily as needed for pain. (Patient not taking: Reported on 11/10/2020)    . Probiotic Product (PROBIOTIC DAILY PO) Take 1 tablet by mouth daily.  (Patient not taking: Reported on 11/10/2020)     No current facility-administered medications for this visit.     Objective: Vital signs in last 24 hours: BP 104/67   Pulse 79   Temp 98.8 F (37.1 C)   Ht 5' 3"  (1.6 m)   Wt 105 lb (47.6 kg)   LMP 06/03/2016 (Approximate)   BMI 18.60 kg/m   Intake/Output from previous day: No intake/output data recorded. Intake/Output this shift: @IOTHISSHIFT @   Physical Exam Vitals reviewed.  Constitutional:      Appearance: Normal appearance.  Abdominal:     General: Abdomen is flat.     Palpations: Abdomen is soft.     Comments: Mild suprapubic discomfort.   Genitourinary:    Comments: Normal external genitalia. 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 Urethral meatus not seen. Unable to do an internal exam but no obvious prolapse noted.   Musculoskeletal:        General: No swelling or tenderness. Normal range of motion.  Skin:    General: Skin is warm and dry.  Neurological:     General: No focal deficit present.     Mental Status: She is alert and oriented to person, place, and time.     Lab Results:  No results found for this or any previous visit (from the past 24 hour(s)).  BMET No results for input(s): NA, K, CL, CO2, GLUCOSE, BUN, CREATININE, CALCIUM in the last 72 hours. PT/INR No results for input(s): LABPROT, INR in the last 72 hours. ABG No results for input(s): PHART, HCO3 in the last 72 hours.  Invalid  input(s): PCO2, PO2 UA has 3-10 RBC's. Results for orders placed or performed in visit on 11/10/20 (from the past 24 hour(s))  Urinalysis, Routine w reflex microscopic     Status: Abnormal   Collection Time: 11/10/20  9:07 AM  Result Value Ref Range   Specific Gravity, UA 1.025 1.005 - 1.030   pH, UA 5.5 5.0 - 7.5   Color, UA Amber (A) Yellow   Appearance Ur Clear Clear  Leukocytes,UA Negative Negative   Protein,UA Negative Negative/Trace   Glucose, UA Negative Negative   Ketones, UA Negative Negative   RBC, UA 1+ (A) Negative   Bilirubin, UA Negative Negative   Urobilinogen, Ur 0.2 0.2 - 1.0 mg/dL   Nitrite, UA Negative Negative   Microscopic Examination See below:    Narrative   Performed at:  [facility] [address] [place], [place]  [id number] Lab Director: Mina Marble [place], Phone:  [phone]  Microscopic Examination     Status: Abnormal   Collection Time: 11/10/20  9:07 AM   Urine  Result Value Ref Range   WBC, UA 0-5 0 - 5 /hpf   RBC 3-10 (A) 0 - 2 /hpf   Epithelial Cells (non renal) 0-10 0 - 10 /hpf   Renal Epithel, UA None seen None seen /hpf   Mucus, UA Present Not Estab.   Bacteria, UA Few (A) None seen/Few   Narrative   Performed at:  [facility] [address] [place], [place]  [id number] Lab Director: [place], Phone:  [phone]    Outside records and labs reviewed.  Studies/Results: No results found.   Assessment/Plan: Sensation of incomplete emptying.  Her PVR is 5m.   I will get her set up for cystoscopy and possible urethral dilation since that has helped in the past.  Microhematuria.   She needs a CT and the cystoscopy.  Atrophic vaginitis with introital stenosis.  This could be contributing to her voiding symtoms.  I will get her started on premarin cream and reviewed the instructions and side effects.   OAB dry.  She will continue the oxybutynin.   Meds ordered this encounter  Medications  .  conjugated estrogens (PREMARIN) vaginal cream    Sig: Apply 0.5 gm (pea sized amount) using the tip of the finger nightly for 2 weeks then 2-3x weekly.    Dispense:  42.5 g    Refill:  12     Orders Placed This Encounter  Procedures  . CT HEMATURIA WORKUP    Standing Status:   Future    Standing Expiration Date:   12/10/2020    Order Specific Question:   Reason for Exam (SYMPTOM  OR DIAGNOSIS REQUIRED)    Answer:   microhematuria    Order Specific Question:   [facility]?    [facility]   Order Specific Question:   Radiology Contrast Protocol - do NOT remove file path    Answer:   [email or web address]    Order Specific Question:   Is patient pregnant?    Answer:   No  . Urinalysis, Routine w reflex microscopic  . BLADDER SCAN AMB NON-IMAGING     Return for Next available with CT results for possible cystoscopy and urethral dilation. .    CC: Dr. PConsuello Masse   JIrine Seal4/21/2022 [phone]

## 2020-11-10 NOTE — Progress Notes (Signed)
post void residual= 0  Urological Symptom Review  Patient is experiencing the following symptoms: Frequent urination Hard to postpone urination Get up at night to urinate Stream starts and stops Trouble starting stream Blood in urine Weak stream   Review of Systems  Gastrointestinal (upper)  : Indigestion/heartburn  Gastrointestinal (lower) : Negative for lower GI symptoms  Constitutional : Negative for symptoms  Skin: Negative for skin symptoms  Eyes: Negative for eye symptoms  Ear/Nose/Throat : Negative for Ear/Nose/Throat symptoms  Hematologic/Lymphatic: Negative for Hematologic/Lymphatic symptoms  Cardiovascular : Negative for cardiovascular symptoms  Respiratory : Negative for respiratory symptoms  Endocrine: Negative for endocrine symptoms  Musculoskeletal: Negative for musculoskeletal symptoms  Neurological: Negative for neurological symptoms  Psychologic: Negative for psychiatric symptoms

## 2020-11-15 ENCOUNTER — Encounter (INDEPENDENT_AMBULATORY_CARE_PROVIDER_SITE_OTHER): Payer: Self-pay | Admitting: *Deleted

## 2020-11-17 ENCOUNTER — Other Ambulatory Visit: Payer: Self-pay

## 2020-11-17 DIAGNOSIS — N952 Postmenopausal atrophic vaginitis: Secondary | ICD-10-CM

## 2020-11-17 MED ORDER — ESTRADIOL 0.1 MG/GM VA CREA: TOPICAL_CREAM | VAGINAL | 12 refills | Status: DC

## 2020-12-09 ENCOUNTER — Ambulatory Visit (HOSPITAL_COMMUNITY)
Admission: RE | Admit: 2020-12-09 | Discharge: 2020-12-09 | Disposition: A | Discharge status: Home / Self Care | Payer: BC Managed Care – PPO | Source: Ambulatory Visit | Attending: Urology | Admitting: Urology

## 2020-12-09 ENCOUNTER — Other Ambulatory Visit: Payer: Self-pay

## 2020-12-09 DIAGNOSIS — R3129 Other microscopic hematuria: Secondary | ICD-10-CM | POA: Insufficient documentation

## 2020-12-09 LAB — POCT I-STAT CREATININE: Creatinine, Ser: 0.8 mg/dL (ref 0.44–1.00)

## 2020-12-09 MED ORDER — IOHEXOL 300 MG/ML  SOLN
100.0000 mL | Freq: Once | INTRAMUSCULAR | Status: AC | PRN
  Administered 2020-12-09: 100 mL via INTRAVENOUS

## 2020-12-13 ENCOUNTER — Telehealth: Payer: Self-pay

## 2020-12-13 DIAGNOSIS — N200 Calculus of kidney: Secondary | ICD-10-CM

## 2020-12-13 MED ORDER — TAMSULOSIN HCL 0.4 MG PO CAPS: 0.4000 mg | ORAL_CAPSULE | Freq: Every day | ORAL | 0 refills | Status: DC

## 2020-12-13 NOTE — Telephone Encounter (Signed)
Patient called and made aware. KUB order placed and rx sent in.

## 2020-12-13 NOTE — Telephone Encounter (Signed)
-----   Message from Irine Seal, MD sent at 12/12/2020  6:06 PM EDT ----- She has a left distal stone that is probably causing the hematuria and voiding symptoms.  I would like to get a KUB prior to her f/u on 02-05-23 to see if it has passed and I would like to get her started on tamsulosin 0.29m po qday #30.  She will need to be warned of the risks of nasal congestion and dizziness.

## 2020-12-14 NOTE — Telephone Encounter (Signed)
Patient called and made aware.

## 2020-12-14 NOTE — Telephone Encounter (Signed)
Tamsulosin with oxybutynin is ok.

## 2020-12-22 ENCOUNTER — Telehealth: Payer: Self-pay

## 2020-12-22 NOTE — Telephone Encounter (Signed)
Message sent to MD

## 2020-12-22 NOTE — Telephone Encounter (Signed)
Estradiol cream 0.5gm vaginally at bedtime 2-3 x weekly, disp 30gm or the closest tube size to that..   3 refills.   I can't recall the strength but the pharmacy should be able to let us know.

## 2020-12-26 ENCOUNTER — Other Ambulatory Visit: Payer: Self-pay

## 2020-12-26 NOTE — Telephone Encounter (Signed)
[place]- rx was sent on 04/28. Pharmacy will fill now for patient.

## 2020-12-26 NOTE — Progress Notes (Signed)
Opened in error

## 2021-01-05 ENCOUNTER — Other Ambulatory Visit: Payer: Self-pay

## 2021-01-05 ENCOUNTER — Ambulatory Visit: Payer: BC Managed Care – PPO | Admitting: Urology

## 2021-01-05 ENCOUNTER — Ambulatory Visit (HOSPITAL_COMMUNITY)
Admission: RE | Admit: 2021-01-05 | Discharge: 2021-01-05 | Disposition: A | Discharge status: Home / Self Care | Payer: BC Managed Care – PPO | Source: Ambulatory Visit | Attending: Urology | Admitting: Urology

## 2021-01-05 ENCOUNTER — Encounter: Payer: Self-pay | Admitting: Urology

## 2021-01-05 VITALS — BP 108/70 | HR 92 | Ht 62.0 in | Wt 105.0 lb

## 2021-01-05 DIAGNOSIS — N952 Postmenopausal atrophic vaginitis: Secondary | ICD-10-CM

## 2021-01-05 DIAGNOSIS — N200 Calculus of kidney: Secondary | ICD-10-CM | POA: Diagnosis present

## 2021-01-05 DIAGNOSIS — R3129 Other microscopic hematuria: Secondary | ICD-10-CM

## 2021-01-05 DIAGNOSIS — N201 Calculus of ureter: Secondary | ICD-10-CM

## 2021-01-05 DIAGNOSIS — N3281 Overactive bladder: Secondary | ICD-10-CM | POA: Diagnosis not present

## 2021-01-05 LAB — URINALYSIS, ROUTINE W REFLEX MICROSCOPIC
Bilirubin, UA: NEGATIVE
Glucose, UA: NEGATIVE
Ketones, UA: NEGATIVE
Leukocytes,UA: NEGATIVE
Nitrite, UA: NEGATIVE
Protein,UA: NEGATIVE
Specific Gravity, UA: 1.02 (ref 1.005–1.030)
Urobilinogen, Ur: 0.2 mg/dL (ref 0.2–1.0)
pH, UA: 6 (ref 5.0–7.5)

## 2021-01-05 LAB — MICROSCOPIC EXAMINATION: Bacteria, UA: NONE SEEN

## 2021-01-05 NOTE — Progress Notes (Signed)
Urological Symptom Review  Patient is experiencing the following symptoms: Frequent urination Get up at night to urinate Leakage of urine Stream starts and stops Weak stream   Review of Systems  Gastrointestinal (upper)  : Negative for upper GI symptoms  Gastrointestinal (lower) : Negative for lower GI symptoms  Constitutional : Negative for symptoms  Skin: Negative for skin symptoms  Eyes: Negative for eye symptoms  Ear/Nose/Throat : Negative for Ear/Nose/Throat symptoms  Hematologic/Lymphatic: Negative for Hematologic/Lymphatic symptoms  Cardiovascular : Negative for cardiovascular symptoms  Respiratory : Negative for respiratory symptoms  Endocrine: Negative for endocrine symptoms  Musculoskeletal: Negative for musculoskeletal symptoms  Neurological: Negative for neurological symptoms  Psychologic: Negative for psychiatric symptoms

## 2021-01-05 NOTE — Progress Notes (Signed)
Subjective: 1. Microhematuria      01/05/21: Kristen Cooper returns today in f/u.  The CT showed a 5x66m left distal ureteral stone without obstruction and small bilateral renal stones.   She was placed on tamsulosin and remains on Oxybutyinin with improvement in the OAB symptoms.  A KUB prior to this visit shows a stable left distal stone.   She has no pain and has had no gross hematuria.    11/10/20: Kristen Cooper a 55yo female who is sent for evaluation of a sensation of incomplete emptying.  She has frequency and urgency.  She has nocturia x 2 on therapy.  She has some hesitancy.  She has an intermittently slow stream.  She does some postural voiding and crede and can occasionally get more out.  She has no incontinence.  She has no prolapse symptoms.  She has had no gross hematuria but has blood on her dip UA's and has 3-10 RBC's today.  She is G0P0.   She has a history of OAB wet and is on oxybutynin ER 14mdaily.   She had a urethral dilation remotely which helped her symptoms.   She has Crohn's disease.  She saw Dr. JaMichela Pitchern the past.   ROS:  ROS  Allergies  Allergen Reactions   Other Other (See Comments)   Oatmeal Hives and Itching    Past Medical History:  Diagnosis Date   [facility] dr reLaural Golden  rheumologist-  dr beAmil Amen dx 1987   Dermatography 08/2012   To many histamine cells   Hypertension    Inflammatory bowel disease     Past Surgical History:  Procedure Laterality Date   APPENDECTOMY  1987   COLONOSCOPY N/A 10/27/2015   Procedure: COLONOSCOPY;  Surgeon: NaRogene HoustonMD;  [facility];  Service: Endoscopy;  Laterality: N/A;  11:00   ESOPHAGEAL DILATION N/A 03/03/2020   Procedure: ESOPHAGEAL DILATION;  Surgeon: ReRogene HoustonMD;  [facility];  Service: Endoscopy;  Laterality: N/A;   ESOPHAGOGASTRODUODENOSCOPY N/A 03/03/2020   Procedure: ESOPHAGOGASTRODUODENOSCOPY (EGD);  Surgeon: ReRogene HoustonMD;  [facility];   Service: Endoscopy;  Laterality: N/A;  1155   MUSCLE BIOPSY Right 01/02/2017   Procedure: UPPER RIGHT  EXTREMITY MUSCLE BIOPSY;  Surgeon: RaRalene OkMD;  [facility] Service: General;  Laterality: Right;   UPPER GASTROINTESTINAL ENDOSCOPY  ?    Social History   Socioeconomic History   Marital status: Married    Spouse name: Not on file   Number of children: Not on file   Years of education: Not on file   Highest education level: Not on file  Occupational History   Not on file  Tobacco Use   Smoking status: Never   Smokeless tobacco: Never  Vaping Use   Vaping Use: Never used  Substance and Sexual Activity   Alcohol use: No    Alcohol/week: 0.0 standard drinks   Drug use: No   Sexual activity: Not on file  Other Topics Concern   Not on file  Social History Narrative   Not on file   Social Determinants of Health   Financial Resource Strain: Not on file  Food Insecurity: Not on file  Transportation Needs: Not on file  Physical Activity: Not on file  Stress: Not on file  Social Connections: Not on file  Intimate Partner Violence: Not on file    Family History  Problem Relation Age of Onset   Crohn's disease Mother    Irritable bowel syndrome Father    Hypertension Brother    Healthy Brother    Healthy Brother     Anti-infectives: Anti-infectives (From admission, onward)    None       Current Outpatient Medications  Medication Sig Dispense Refill   acetaminophen (TYLENOL) 650 MG CR tablet Take 1,300 mg by mouth 2 (two) times daily as needed for pain.     cetirizine (ZYRTEC) 10 MG tablet Take 10 mg by mouth at bedtime.      estradiol (ESTRACE) 0.1 MG/GM vaginal cream Apply 0.5 gm (pea size amount) using the tip of the finger nightly for 2 weeks then 2-3x weekly. 42.5 g 12   fexofenadine (ALLEGRA) 180 MG tablet Take 180 mg by mouth daily.      folic acid (FOLVITE) 850 MCG tablet Take 1,600 mcg by mouth daily.      inFLIXimab  (REMICADE IV) Inject 5 mg into the vein See admin instructions. IV once every 8 weeks.     lisinopril-hydrochlorothiazide (PRINZIDE,ZESTORETIC) 10-12.5 MG per tablet Take 0.5 tablets by mouth daily.      Methotrexate, PF, 10 MG/0.4ML SOAJ Inject 0.4 mLs into the skin once a week.     Olopatadine HCl (PATADAY) 0.7 % SOLN Place 1 drop into both eyes daily.     oxybutynin (DITROPAN-XL) 10 MG 24 hr tablet Take 10 mg by mouth at bedtime.     Probiotic Product (PROBIOTIC DAILY PO) Take 1 tablet by mouth daily.     Propylene Glycol (SYSTANE BALANCE) 0.6 % SOLN Place 1 application into both eyes daily as needed (Dry eyes).     tamsulosin (FLOMAX) 0.4 MG CAPS capsule Take 1 capsule (0.4 mg total) by mouth daily. 30 capsule 0   No current facility-administered medications for this visit.     Objective: Vital signs in last 24 hours: BP 108/70   Pulse 92   Ht 5' 2"  (1.575 m)   Wt 105 lb (47.6 kg)   LMP 06/03/2016 (Approximate)   BMI 19.20 kg/m   Intake/Output from previous day: No intake/output data recorded. Intake/Output this shift: @IOTHISSHIFT @   Physical Exam Vitals reviewed.  Constitutional:      Appearance: Normal appearance.  Cardiovascular:     Rate and Rhythm: Normal rate and regular rhythm.  Pulmonary:     Effort: Pulmonary effort is normal.     Breath sounds: Normal breath sounds.  Neurological:     Mental Status: She is alert.    Lab Results:  Results for orders placed or performed in visit on 01/05/21 (from the past 24 hour(s))  Urinalysis, Routine w reflex microscopic     Status: Abnormal   Collection Time: 01/05/21 10:42 AM  Result Value Ref Range   Specific Gravity, UA 1.020 1.005 - 1.030   pH, UA 6.0 5.0 - 7.5   Color, UA Yellow Yellow   Appearance Ur Clear Clear   Leukocytes,UA Negative Negative   Protein,UA Negative Negative/Trace   Glucose, UA Negative Negative   Ketones, UA Negative Negative   RBC, UA 1+ (A) Negative   Bilirubin, UA Negative Negative    Urobilinogen, Ur 0.2 0.2 - 1.0 mg/dL   Nitrite, UA Negative Negative   Microscopic Examination See below:    Narrative   Performed at:  [facility] [address], [place], [place]  [id number] Lab Director: [place], Phone:  [phone]  Microscopic Examination  Status: None   Collection Time: 01/05/21 10:42 AM   Urine  Result Value Ref Range   WBC, UA 0-5 0 - 5 /hpf   RBC 0-2 0 - 2 /hpf   Epithelial Cells (non renal) 0-10 0 - 10 /hpf   Mucus, UA Present Not Estab.   Bacteria, UA None seen None seen/Few   Narrative   Performed at:  [facility] [address] [place], [place]  [id number] Lab Director: [place], Phone:  [phone]    BMET No results for input(s): NA, K, CL, CO2, GLUCOSE, BUN, CREATININE, CALCIUM in the last 72 hours. PT/INR No results for input(s): LABPROT, INR in the last 72 hours. ABG No results for input(s): PHART, HCO3 in the last 72 hours.  Invalid input(s): PCO2, PO2 . Results for orders placed or performed in visit on 01/05/21 (from the past 24 hour(s))  Urinalysis, Routine w reflex microscopic     Status: Abnormal   Collection Time: 01/05/21 10:42 AM  Result Value Ref Range   Specific Gravity, UA 1.020 1.005 - 1.030   pH, UA 6.0 5.0 - 7.5   Color, UA Yellow Yellow   Appearance Ur Clear Clear   Leukocytes,UA Negative Negative   Protein,UA Negative Negative/Trace   Glucose, UA Negative Negative   Ketones, UA Negative Negative   RBC, UA 1+ (A) Negative   Bilirubin, UA Negative Negative   Urobilinogen, Ur 0.2 0.2 - 1.0 mg/dL   Nitrite, UA Negative Negative   Microscopic Examination See below:    Narrative   Performed at:  [facility] [address] [place], [place]  [id number] Lab Director: Mina Marble MT, Phone:  [phone]  Microscopic Examination     Status: None   Collection Time: 01/05/21 10:42 AM   Urine  Result Value Ref Range   WBC, UA 0-5 0 - 5 /hpf   RBC 0-2 0  - 2 /hpf   Epithelial Cells (non renal) 0-10 0 - 10 /hpf   Mucus, UA Present Not Estab.   Bacteria, UA None seen None seen/Few   Narrative   Performed at:  [facility] [address], [place], [place]  [id number] Lab Director: Mina Marble MT, Phone:  [phone]   UA is unremarkable today.   Outside records and labs reviewed.  Studies/Results: CLINICAL DATA:  Microhematuria.   EXAM: CT ABDOMEN AND PELVIS WITHOUT AND WITH CONTRAST   TECHNIQUE: Multidetector CT imaging of the abdomen and pelvis was performed following the standard protocol before and following the bolus administration of intravenous contrast.   CONTRAST:  188m OMNIPAQUE IOHEXOL 300 MG/ML  SOLN   COMPARISON:  08/20/2004   FINDINGS: Lower chest: Unremarkable   Hepatobiliary: Innumerable tiny hypodensities in the liver parenchyma are again noted, compatible with cyst. Dominant 2.3 cm cyst noted posterior right liver There is no evidence for gallstones, gallbladder wall thickening, or pericholecystic fluid. No intrahepatic or extrahepatic biliary dilation.   Pancreas: No focal mass lesion. No dilatation of the main duct. No intraparenchymal cyst. No peripancreatic edema.   Spleen: No splenomegaly. No focal mass lesion.   Adrenals/Urinary Tract: No adrenal nodule or mass.   Precontrast imaging shows a nonobstructing 1-2 mm interpolar right renal stone with no stones visible in the right ureter no secondary changes in the right kidney or ureter. 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 Despite the lack of substantial left hydroureteronephrosis, a  5 x 6 x 4 mm stone is identified in the distal left ureter about 15-20 mm proximal to the UVJ (axial precontrast image 60 of series 2 and relationship to the left ureter well demonstrated on delayed coronal image 38 of series 16).   Imaging after IV contrast  administration shows no overtly suspicious enhancing mass lesion in either kidney. There does appear to be very subtly decreased perfusion to the left kidney suggesting underlying component of obstructive uropathy.   Delayed imaging shows scattered very tiny hypoattenuating lesions in both kidneys, too small to characterize but likely benign. Delayed post-contrast imaging shows no wall thickening or soft tissue filling defect in either intrarenal collecting system or renal pelvis. Both ureters are well opacified without evidence for wall thickening, soft tissue lesion or focal dilatation. Delayed imaging of the bladder shows no focal wall thickening or mass lesion.   Stomach/Bowel: Stomach is moderately distended with fluid. Duodenum is normally positioned as is the ligament of Treitz. No small bowel wall thickening. No small bowel dilatation. The terminal ileum is normal. The appendix is not well visualized, but there is no edema or inflammation in the region of the cecum. No gross colonic mass. No colonic wall thickening.   Vascular/Lymphatic: No abdominal aortic aneurysm. There is no gastrohepatic or hepatoduodenal ligament lymphadenopathy. No retroperitoneal or mesenteric lymphadenopathy. No pelvic sidewall lymphadenopathy.   Reproductive: The uterus is unremarkable.  There is no adnexal mass.   Other: No intraperitoneal free fluid.   Musculoskeletal: No worrisome lytic or sclerotic osseous abnormality.   IMPRESSION: 1. 5 x 6 x 4 mm distal left ureteral stone without substantial left hydroureteronephrosis. There does appear to be very subtly decreased perfusion to the left kidney suggesting underlying component of obstructive uropathy. 2. Bilateral nonobstructing nephrolithiasis. 3. Tiny hypoattenuating lesions in both kidneys, too small to characterize but likely benign. 4. Innumerable tiny hypodensities in the liver parenchyma, compatible with cysts.  KUB films  reviewed.  Report is not back.  Assessment/Plan: Left distal ureteral stone.   I discussed ESWL and Ureteroscopy and will get her set up for Ureteroscopy with possible urethral dilation because of her history of voiding difficulty.   I reviewed the risks of bleeding, infection, ureteral injury, need for stent and secondary procedures, thrombotic events and anesthetic complications.    She will stay on tamsulosin.   Atrophic vaginitis with introital stenosis.  Stay on premarin cream  OAB dry.  She will continue the oxybutynin.   No orders of the defined types were placed in this encounter.    Orders Placed This Encounter  Procedures   Microscopic Examination   Urinalysis, Routine w reflex microscopic     Return for Next available schedule surgery. .    CC: Dr. Consuello Masse    Irine Seal 01/05/2021 [phone]

## 2021-01-05 NOTE — H&P (View-Only) (Signed)
Subjective: 1. Microhematuria      01/05/21: Kristen Cooper returns today in f/u.  The CT showed a 5x63m left distal ureteral stone without obstruction and small bilateral renal stones.   She was placed on tamsulosin and remains on Oxybutyinin with improvement in the OAB symptoms.  A KUB prior to this visit shows a stable left distal stone.   She has no pain and has had no gross hematuria.    11/10/20: Kristen Cooper a 55yo female who is sent for evaluation of a sensation of incomplete emptying.  She has frequency and urgency.  She has nocturia x 2 on therapy.  She has some hesitancy.  She has an intermittently slow stream.  She does some postural voiding and crede and can occasionally get more out.  She has no incontinence.  She has no prolapse symptoms.  She has had no gross hematuria but has blood on her dip UA's and has 3-10 RBC's today.  She is G0P0.   She has a history of OAB wet and is on oxybutynin ER 138mdaily.   She had a urethral dilation remotely which helped her symptoms.   She has Crohn's disease.  She saw Dr. JaMichela Pitchern the past.   ROS:  ROS  Allergies  Allergen Reactions   Other Other (See Comments)   Oatmeal Hives and Itching    Past Medical History:  Diagnosis Date   [facility] dr setzer/ dr reLaural Golden  rheumologist-  dr beAmil Amen dx 1987   Dermatography 08/2012   To many histamine cells   Hypertension    Inflammatory bowel disease     Past Surgical History:  Procedure Laterality Date   APPENDECTOMY  1987   COLONOSCOPY N/A 10/27/2015   Procedure: COLONOSCOPY;  Surgeon: NaRogene HoustonMD;  [facility];  Service: Endoscopy;  Laterality: N/A;  11:00   ESOPHAGEAL DILATION N/A 03/03/2020   Procedure: ESOPHAGEAL DILATION;  Surgeon: ReRogene HoustonMD;  [facility];  Service: Endoscopy;  Laterality: N/A;   ESOPHAGOGASTRODUODENOSCOPY N/A 03/03/2020   Procedure: ESOPHAGOGASTRODUODENOSCOPY (EGD);  Surgeon: ReRogene HoustonMD;  [facility];   Service: Endoscopy;  Laterality: N/A;  1155   MUSCLE BIOPSY Right 01/02/2017   Procedure: UPPER RIGHT  EXTREMITY MUSCLE BIOPSY;  Surgeon: RaRalene OkMD;  [facility] Service: General;  Laterality: Right;   UPPER GASTROINTESTINAL ENDOSCOPY  ?    Social History   Socioeconomic History   Marital status: Married    Spouse name: Not on file   Number of children: Not on file   Years of education: Not on file   Highest education level: Not on file  Occupational History   Not on file  Tobacco Use   Smoking status: Never   Smokeless tobacco: Never  Vaping Use   Vaping Use: Never used  Substance and Sexual Activity   Alcohol use: No    Alcohol/week: 0.0 standard drinks   Drug use: No   Sexual activity: Not on file  Other Topics Concern   Not on file  Social History Narrative   Not on file   Social Determinants of Health   Financial Resource Strain: Not on file  Food Insecurity: Not on file  Transportation Needs: Not on file  Physical Activity: Not on file  Stress: Not on file  Social Connections: Not on file  Intimate Partner Violence: Not on file    Family History  Problem Relation Age of Onset   Crohn's disease Mother    Irritable bowel syndrome Father    Hypertension Brother    Healthy Brother    Healthy Brother     Anti-infectives: Anti-infectives (From admission, onward)    None       Current Outpatient Medications  Medication Sig Dispense Refill   acetaminophen (TYLENOL) 650 MG CR tablet Take 1,300 mg by mouth 2 (two) times daily as needed for pain.     cetirizine (ZYRTEC) 10 MG tablet Take 10 mg by mouth at bedtime.      estradiol (ESTRACE) 0.1 MG/GM vaginal cream Apply 0.5 gm (pea size amount) using the tip of the finger nightly for 2 weeks then 2-3x weekly. 42.5 g 12   fexofenadine (ALLEGRA) 180 MG tablet Take 180 mg by mouth daily.      folic acid (FOLVITE) 646 MCG tablet Take 1,600 mcg by mouth daily.      inFLIXimab  (REMICADE IV) Inject 5 mg into the vein See admin instructions. IV once every 8 weeks.     lisinopril-hydrochlorothiazide (PRINZIDE,ZESTORETIC) 10-12.5 MG per tablet Take 0.5 tablets by mouth daily.      Methotrexate, PF, 10 MG/0.4ML SOAJ Inject 0.4 mLs into the skin once a week.     Olopatadine HCl (PATADAY) 0.7 % SOLN Place 1 drop into both eyes daily.     oxybutynin (DITROPAN-XL) 10 MG 24 hr tablet Take 10 mg by mouth at bedtime.     Probiotic Product (PROBIOTIC DAILY PO) Take 1 tablet by mouth daily.     Propylene Glycol (SYSTANE BALANCE) 0.6 % SOLN Place 1 application into both eyes daily as needed (Dry eyes).     tamsulosin (FLOMAX) 0.4 MG CAPS capsule Take 1 capsule (0.4 mg total) by mouth daily. 30 capsule 0   No current facility-administered medications for this visit.     Objective: Vital signs in last 24 hours: BP 108/70   Pulse 92   Ht 5' 2"  (1.575 m)   Wt 105 lb (47.6 kg)   LMP 06/03/2016 (Approximate)   BMI 19.20 kg/m   Intake/Output from previous day: No intake/output data recorded. Intake/Output this shift: @IOTHISSHIFT @   Physical Exam Vitals reviewed.  Constitutional:      Appearance: Normal appearance.  Cardiovascular:     Rate and Rhythm: Normal rate and regular rhythm.  Pulmonary:     Effort: Pulmonary effort is normal.     Breath sounds: Normal breath sounds.  Neurological:     Mental Status: She is alert.    Lab Results:  Results for orders placed or performed in visit on 01/05/21 (from the past 24 hour(s))  Urinalysis, Routine w reflex microscopic     Status: Abnormal   Collection Time: 01/05/21 10:42 AM  Result Value Ref Range   Specific Gravity, UA 1.020 1.005 - 1.030   pH, UA 6.0 5.0 - 7.5   Color, UA Yellow Yellow   Appearance Ur Clear Clear   Leukocytes,UA Negative Negative   Protein,UA Negative Negative/Trace   Glucose, UA Negative Negative   Ketones, UA Negative Negative   RBC, UA 1+ (A) Negative   Bilirubin, UA Negative Negative    Urobilinogen, Ur 0.2 0.2 - 1.0 mg/dL   Nitrite, UA Negative Negative   Microscopic Examination See below:    Narrative   Performed at:  [facility] [address] [place], [place]  [id number] Lab Director: [place], Phone:  [phone]  Microscopic Examination  Status: None   Collection Time: 01/05/21 10:42 AM   Urine  Result Value Ref Range   WBC, UA 0-5 0 - 5 /hpf   RBC 0-2 0 - 2 /hpf   Epithelial Cells (non renal) 0-10 0 - 10 /hpf   Mucus, UA Present Not Estab.   Bacteria, UA None seen None seen/Few   Narrative   Performed at:  [facility] [address] [place], [place]  [id number] Lab Director: [place], Phone:  [phone]    BMET No results for input(s): NA, K, CL, CO2, GLUCOSE, BUN, CREATININE, CALCIUM in the last 72 hours. PT/INR No results for input(s): LABPROT, INR in the last 72 hours. ABG No results for input(s): PHART, HCO3 in the last 72 hours.  Invalid input(s): PCO2, PO2 . Results for orders placed or performed in visit on 01/05/21 (from the past 24 hour(s))  Urinalysis, Routine w reflex microscopic     Status: Abnormal   Collection Time: 01/05/21 10:42 AM  Result Value Ref Range   Specific Gravity, UA 1.020 1.005 - 1.030   pH, UA 6.0 5.0 - 7.5   Color, UA Yellow Yellow   Appearance Ur Clear Clear   Leukocytes,UA Negative Negative   Protein,UA Negative Negative/Trace   Glucose, UA Negative Negative   Ketones, UA Negative Negative   RBC, UA 1+ (A) Negative   Bilirubin, UA Negative Negative   Urobilinogen, Ur 0.2 0.2 - 1.0 mg/dL   Nitrite, UA Negative Negative   Microscopic Examination See below:    Narrative   Performed at:  [facility] [address], [place], [place]  [id number] Lab Director: Mina Marble MT, Phone:  [phone]  Microscopic Examination     Status: None   Collection Time: 01/05/21 10:42 AM   Urine  Result Value Ref Range   WBC, UA 0-5 0 - 5 /hpf   RBC 0-2 0  - 2 /hpf   Epithelial Cells (non renal) 0-10 0 - 10 /hpf   Mucus, UA Present Not Estab.   Bacteria, UA None seen None seen/Few   Narrative   Performed at:  [facility] [address] [place], [place]  [id number] Lab Director: Mina Marble MT, Phone:  [phone]   UA is unremarkable today.   Outside records and labs reviewed.  Studies/Results: CLINICAL DATA:  Microhematuria.   EXAM: CT ABDOMEN AND PELVIS WITHOUT AND WITH CONTRAST   TECHNIQUE: Multidetector CT imaging of the abdomen and pelvis was performed following the standard protocol before and following the bolus administration of intravenous contrast.   CONTRAST:  157m OMNIPAQUE IOHEXOL 300 MG/ML  SOLN   COMPARISON:  08/20/2004   FINDINGS: Lower chest: Unremarkable   Hepatobiliary: Innumerable tiny hypodensities in the liver parenchyma are again noted, compatible with cyst. Dominant 2.3 cm cyst noted posterior right liver There is no evidence for gallstones, gallbladder wall thickening, or pericholecystic fluid. No intrahepatic or extrahepatic biliary dilation.   Pancreas: No focal mass lesion. No dilatation of the main duct. No intraparenchymal cyst. No peripancreatic edema.   Spleen: No splenomegaly. No focal mass lesion.   Adrenals/Urinary Tract: No adrenal nodule or mass.   Precontrast imaging shows a nonobstructing 1-2 mm interpolar right renal stone with no stones visible in the right ureter no secondary changes in the right kidney or ureter. 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 Despite the lack of substantial left hydroureteronephrosis, a  5 x 6 x 4 mm stone is identified in the distal left ureter about 15-20 mm proximal to the UVJ (axial precontrast image 60 of series 2 and relationship to the left ureter well demonstrated on delayed coronal image 38 of series 16).   Imaging after IV contrast  administration shows no overtly suspicious enhancing mass lesion in either kidney. There does appear to be very subtly decreased perfusion to the left kidney suggesting underlying component of obstructive uropathy.   Delayed imaging shows scattered very tiny hypoattenuating lesions in both kidneys, too small to characterize but likely benign. Delayed post-contrast imaging shows no wall thickening or soft tissue filling defect in either intrarenal collecting system or renal pelvis. Both ureters are well opacified without evidence for wall thickening, soft tissue lesion or focal dilatation. Delayed imaging of the bladder shows no focal wall thickening or mass lesion.   Stomach/Bowel: Stomach is moderately distended with fluid. Duodenum is normally positioned as is the ligament of Treitz. No small bowel wall thickening. No small bowel dilatation. The terminal ileum is normal. The appendix is not well visualized, but there is no edema or inflammation in the region of the cecum. No gross colonic mass. No colonic wall thickening.   Vascular/Lymphatic: No abdominal aortic aneurysm. There is no gastrohepatic or hepatoduodenal ligament lymphadenopathy. No retroperitoneal or mesenteric lymphadenopathy. No pelvic sidewall lymphadenopathy.   Reproductive: The uterus is unremarkable.  There is no adnexal mass.   Other: No intraperitoneal free fluid.   Musculoskeletal: No worrisome lytic or sclerotic osseous abnormality.   IMPRESSION: 1. 5 x 6 x 4 mm distal left ureteral stone without substantial left hydroureteronephrosis. There does appear to be very subtly decreased perfusion to the left kidney suggesting underlying component of obstructive uropathy. 2. Bilateral nonobstructing nephrolithiasis. 3. Tiny hypoattenuating lesions in both kidneys, too small to characterize but likely benign. 4. Innumerable tiny hypodensities in the liver parenchyma, compatible with cysts.  KUB films  reviewed.  Report is not back.  Assessment/Plan: Left distal ureteral stone.   I discussed ESWL and Ureteroscopy and will get her set up for Ureteroscopy with possible urethral dilation because of her history of voiding difficulty.   I reviewed the risks of bleeding, infection, ureteral injury, need for stent and secondary procedures, thrombotic events and anesthetic complications.    She will stay on tamsulosin.   Atrophic vaginitis with introital stenosis.  Stay on premarin cream  OAB dry.  She will continue the oxybutynin.   No orders of the defined types were placed in this encounter.    Orders Placed This Encounter  Procedures   Microscopic Examination   Urinalysis, Routine w reflex microscopic     Return for Next available schedule surgery. .    CC: Dr. Consuello Masse    Irine Seal 01/05/2021 [phone]

## 2021-01-06 ENCOUNTER — Other Ambulatory Visit: Payer: Self-pay | Admitting: Urology

## 2021-01-11 ENCOUNTER — Encounter (HOSPITAL_COMMUNITY): Payer: Self-pay

## 2021-01-11 ENCOUNTER — Other Ambulatory Visit: Payer: Self-pay

## 2021-01-11 ENCOUNTER — Encounter (HOSPITAL_COMMUNITY)
Admission: RE | Admit: 2021-01-11 | Discharge: 2021-01-11 | Disposition: A | Discharge status: Home / Self Care | Payer: BC Managed Care – PPO | Source: Ambulatory Visit | Attending: Urology | Admitting: Urology

## 2021-01-11 DIAGNOSIS — N202 Calculus of kidney with calculus of ureter: Secondary | ICD-10-CM | POA: Diagnosis present

## 2021-01-11 DIAGNOSIS — Z79899 Other long term (current) drug therapy: Secondary | ICD-10-CM | POA: Diagnosis not present

## 2021-01-11 DIAGNOSIS — K5090 Crohn's disease, unspecified, without complications: Secondary | ICD-10-CM | POA: Diagnosis not present

## 2021-01-11 DIAGNOSIS — N952 Postmenopausal atrophic vaginitis: Secondary | ICD-10-CM | POA: Diagnosis not present

## 2021-01-11 DIAGNOSIS — N132 Hydronephrosis with renal and ureteral calculous obstruction: Secondary | ICD-10-CM | POA: Diagnosis not present

## 2021-01-11 DIAGNOSIS — Z01818 Encounter for other preprocedural examination: Secondary | ICD-10-CM | POA: Insufficient documentation

## 2021-01-11 DIAGNOSIS — Z79890 Hormone replacement therapy (postmenopausal): Secondary | ICD-10-CM | POA: Diagnosis not present

## 2021-01-11 HISTORY — DX: Unspecified osteoarthritis, unspecified site: M19.90

## 2021-01-11 HISTORY — DX: Polymyositis, organ involvement unspecified: M33.20

## 2021-01-11 HISTORY — DX: Gastro-esophageal reflux disease without esophagitis: K21.9

## 2021-01-11 HISTORY — DX: Personal history of urinary calculi: Z87.442

## 2021-01-11 LAB — CBC
HCT: 43.1 % (ref 36.0–46.0)
Hemoglobin: 14.5 g/dL (ref 12.0–15.0)
MCH: 32.8 pg (ref 26.0–34.0)
MCHC: 33.6 g/dL (ref 30.0–36.0)
MCV: 97.5 fL (ref 80.0–100.0)
Platelets: 247 10*3/uL (ref 150–400)
RBC: 4.42 MIL/uL (ref 3.87–5.11)
RDW: 12.7 % (ref 11.5–15.5)
WBC: 7.2 10*3/uL (ref 4.0–10.5)
nRBC: 0 % (ref 0.0–0.2)

## 2021-01-11 LAB — BASIC METABOLIC PANEL
Anion gap: 8 (ref 5–15)
BUN: 25 mg/dL — ABNORMAL HIGH (ref 6–20)
CO2: 28 mmol/L (ref 22–32)
Calcium: 9.4 mg/dL (ref 8.9–10.3)
Chloride: 104 mmol/L (ref 98–111)
Creatinine, Ser: 0.87 mg/dL (ref 0.44–1.00)
GFR, Estimated: >60 mL/min (ref >60)
Glucose, Bld: 96 mg/dL (ref 70–99)
Potassium: 3.8 mmol/L (ref 3.5–5.1)
Sodium: 140 mmol/L (ref 135–145)

## 2021-01-11 NOTE — Patient Instructions (Addendum)
DUE TO COVID-19 ONLY ONE VISITOR IS ALLOWED TO COME WITH YOU AND STAY IN THE WAITING ROOM ONLY DURING PRE OP AND PROCEDURE.   **NO VISITORS ARE ALLOWED IN THE SHORT STAY AREA OR RECOVERY ROOM!!**       Your procedure is scheduled on: 01/13/21   Report to [facility] Main  Entrance    Report to admitting at 7:00 AM   Call this number if you have problems the morning of surgery [phone]   Do not eat food :After Midnight.   May have liquids until 6:00 AM day of surgery  CLEAR LIQUID DIET  Foods Allowed                                                                     Foods Excluded  Water, Black Coffee and tea, regular and decaf               liquids that you cannot  Plain Jell-O in any flavor  (No red)                                     see through such as: Fruit ices (not with fruit pulp)                                             milk, soups, orange juice              Iced Popsicles (No red)                                                 All solid food                                   Apple juices Sports drinks like Gatorade (No red) Lightly seasoned clear broth or consume(fat free) Sugar, honey syrup     Oral Hygiene is also important to reduce your risk of infection.                                    Remember - BRUSH YOUR TEETH THE MORNING OF SURGERY WITH YOUR REGULAR TOOTHPASTE    Take these medicines the morning of surgery with A SIP OF WATER: Acetaminophen, Fexofenadine, Tamsulosin.   DO NOT TAKE ANY ORAL DIABETIC MEDICATIONS DAY OF YOUR SURGERY                              You may not have any metal on your body including hair pins, jewelry, and body piercing             Do not wear make-up, lotions, powders, perfumes, or deodorant  Do not wear nail polish including gel and S&S, artificial/acrylic  nails, or any other type of covering on natural nails including finger and toenails. If you have artificial nails, gel coating, etc. that needs to be  removed by a nail salon please have this removed prior to surgery or surgery may need to be canceled/ delayed if the surgeon/ anesthesia feels like they are unable to be safely monitored.   Do not shave  48 hours prior to surgery.    Do not bring valuables to the hospital. [place].     Patients discharged the day of surgery will not be allowed to drive home.  Special Instructions: Bring a copy of your healthcare power of attorney and living will documents         the day of surgery if you haven't scanned them in before.              Please read over the following fact sheets you were given: IF YOU HAVE QUESTIONS ABOUT YOUR PRE OP INSTRUCTIONS PLEASE CALL [phone]   [facility] - Preparing for Surgery Before surgery, you can play an important role.  Because skin is not sterile, your skin needs to be as free of germs as possible.  You can reduce the number of germs on your skin by washing with CHG (chlorahexidine gluconate) soap before surgery.  CHG is an antiseptic cleaner which kills germs and bonds with the skin to continue killing germs even after washing. Please DO NOT use if you have an allergy to CHG or antibacterial soaps.  If your skin becomes reddened/irritated stop using the CHG and inform your nurse when you arrive at Short Stay. Do not shave (including legs and underarms) for at least 48 hours prior to the first CHG shower.  You may shave your face/neck.  Please follow these instructions carefully:  1.  Shower with CHG Soap the night before surgery and the  morning of surgery.  2.  If you choose to wash your hair, wash your hair first as usual with your normal  shampoo.  3.  After you shampoo, rinse your hair and body thoroughly to remove the shampoo.                             4.  Use CHG as you would any other liquid soap.  You can apply chg directly to the skin and wash.  Gently with a scrungie or clean washcloth.  5.   Apply the CHG Soap to your body ONLY FROM THE NECK DOWN.   Do   not use on face/ open                           Wound or open sores. Avoid contact with eyes, ears mouth and   genitals (private parts).                       Wash face,  Genitals (private parts) with your normal soap.             6.  Wash thoroughly, paying special attention to the area where your    surgery  will be performed.  7.  Thoroughly rinse your body with warm water from the neck down.  8.  DO NOT shower/wash with your normal soap after using and rinsing off  the CHG Soap.                9.  Pat yourself dry with a clean towel.            10.  Wear clean pajamas.            11.  Place clean sheets on your bed the night of your first shower and do not  sleep with pets. Day of Surgery : Do not apply any lotions/deodorants the morning of surgery.  Please wear clean clothes to the hospital/surgery center.  FAILURE TO FOLLOW THESE INSTRUCTIONS MAY RESULT IN THE CANCELLATION OF YOUR SURGERY  PATIENT SIGNATURE_________________________________  NURSE SIGNATURE__________________________________  ________________________________________________________________________

## 2021-01-11 NOTE — Progress Notes (Addendum)
COVID Vaccine Completed: No Date COVID Vaccine completed: Has received booster: COVID vaccine manufacturer: [place]   Date of COVID positive in last 90 days: No  PCP -  Kassie Mends, PA Cardiologist - N/A   Chest x-ray - N/A EKG - 01/11/21 Epic Stress Test - N/A ECHO - N/A Cardiac Cath - N/A Pacemaker/ICD device last checked: Spinal Cord Stimulator:  Sleep Study - N/A CPAP -   Fasting Blood Sugar - N/A Checks Blood Sugar _____ times a day  Blood Thinner Instructions: N/A Aspirin Instructions: Last Dose:  Activity level: Can go up a flight of stairs and perform activities of daily living without stopping and without symptoms of chest pain or shortness of breath. Able to exercise without symptoms.      Anesthesia review: HTN, dysphagia, Pt has polymyositis  Patient denies shortness of breath, fever, cough and chest pain at PAT appointment   Patient verbalized understanding of instructions that were given to them at the PAT appointment. Patient was also instructed that they will need to review over the PAT instructions again at home before surgery.

## 2021-01-13 ENCOUNTER — Encounter (HOSPITAL_COMMUNITY): Payer: Self-pay | Admitting: Urology

## 2021-01-13 ENCOUNTER — Ambulatory Visit (HOSPITAL_COMMUNITY): Payer: BC Managed Care – PPO | Admitting: Certified Registered"

## 2021-01-13 ENCOUNTER — Ambulatory Visit (HOSPITAL_COMMUNITY): Payer: BC Managed Care – PPO | Admitting: Physician Assistant

## 2021-01-13 ENCOUNTER — Ambulatory Visit (HOSPITAL_COMMUNITY)
Admission: RE | Admit: 2021-01-13 | Discharge: 2021-01-13 | Disposition: A | Discharge status: Home / Self Care | Payer: BC Managed Care – PPO | Attending: Urology | Admitting: Urology

## 2021-01-13 ENCOUNTER — Encounter (HOSPITAL_COMMUNITY)
Admission: RE | Disposition: A | Discharge status: Home / Self Care | Payer: Self-pay | Source: Home / Self Care | Attending: Urology

## 2021-01-13 ENCOUNTER — Ambulatory Visit (HOSPITAL_COMMUNITY): Payer: BC Managed Care – PPO

## 2021-01-13 DIAGNOSIS — N132 Hydronephrosis with renal and ureteral calculous obstruction: Secondary | ICD-10-CM | POA: Diagnosis not present

## 2021-01-13 DIAGNOSIS — Z79890 Hormone replacement therapy (postmenopausal): Secondary | ICD-10-CM | POA: Insufficient documentation

## 2021-01-13 DIAGNOSIS — Z79899 Other long term (current) drug therapy: Secondary | ICD-10-CM | POA: Insufficient documentation

## 2021-01-13 DIAGNOSIS — K5090 Crohn's disease, unspecified, without complications: Secondary | ICD-10-CM | POA: Insufficient documentation

## 2021-01-13 DIAGNOSIS — N952 Postmenopausal atrophic vaginitis: Secondary | ICD-10-CM | POA: Insufficient documentation

## 2021-01-13 DIAGNOSIS — N201 Calculus of ureter: Secondary | ICD-10-CM

## 2021-01-13 HISTORY — PX: CYSTOSCOPY/URETEROSCOPY/HOLMIUM LASER/STENT PLACEMENT: SHX6546

## 2021-01-13 SURGERY — CYSTOSCOPY/URETEROSCOPY/HOLMIUM LASER/STENT PLACEMENT
Anesthesia: General | Laterality: Left

## 2021-01-13 MED ORDER — SODIUM CHLORIDE 0.9% FLUSH: 3.0000 mL | Freq: Two times a day (BID) | INTRAVENOUS | Status: DC

## 2021-01-13 MED ORDER — PROPOFOL 10 MG/ML IV BOLUS
INTRAVENOUS | Status: DC | PRN
  Administered 2021-01-13: 110 mg via INTRAVENOUS

## 2021-01-13 MED ORDER — OXYCODONE HCL 5 MG PO TABS
5.0000 mg | ORAL_TABLET | Freq: Once | ORAL | Status: AC | PRN
  Administered 2021-01-13: 5 mg via ORAL

## 2021-01-13 MED ORDER — CHLORHEXIDINE GLUCONATE 0.12 % MT SOLN
15.0000 mL | Freq: Once | OROMUCOSAL | Status: AC
Start: 2021-01-13 — End: 2021-01-13
  Administered 2021-01-13: 15 mL via OROMUCOSAL

## 2021-01-13 MED ORDER — ONDANSETRON HCL 4 MG/2ML IJ SOLN
INTRAMUSCULAR | Status: AC
  Filled 2021-01-13: qty 2

## 2021-01-13 MED ORDER — FENTANYL CITRATE (PF) 100 MCG/2ML IJ SOLN
INTRAMUSCULAR | Status: DC | PRN
  Administered 2021-01-13: 25 ug via INTRAVENOUS

## 2021-01-13 MED ORDER — MIDAZOLAM HCL 2 MG/2ML IJ SOLN
INTRAMUSCULAR | Status: AC
  Filled 2021-01-13: qty 2

## 2021-01-13 MED ORDER — OXYCODONE HCL 5 MG PO TABS
ORAL_TABLET | ORAL | Status: AC
  Filled 2021-01-13: qty 1

## 2021-01-13 MED ORDER — SCOPOLAMINE 1 MG/3DAYS TD PT72: 1.0000 | MEDICATED_PATCH | TRANSDERMAL | Status: DC

## 2021-01-13 MED ORDER — LACTATED RINGERS IV SOLN: INTRAVENOUS | Status: DC

## 2021-01-13 MED ORDER — ACETAMINOPHEN 325 MG PO TABS: 325.0000 mg | ORAL_TABLET | ORAL | Status: DC | PRN

## 2021-01-13 MED ORDER — OXYCODONE HCL 5 MG/5ML PO SOLN: 5.0000 mg | Freq: Once | ORAL | Status: AC | PRN

## 2021-01-13 MED ORDER — ORAL CARE MOUTH RINSE: 15.0000 mL | Freq: Once | OROMUCOSAL | Status: AC

## 2021-01-13 MED ORDER — FENTANYL CITRATE (PF) 100 MCG/2ML IJ SOLN
25.0000 ug | INTRAMUSCULAR | Status: DC | PRN
  Administered 2021-01-13 (×2): 25 ug via INTRAVENOUS

## 2021-01-13 MED ORDER — MIDAZOLAM HCL 2 MG/2ML IJ SOLN
INTRAMUSCULAR | Status: DC | PRN
  Administered 2021-01-13: 2 mg via INTRAVENOUS

## 2021-01-13 MED ORDER — FENTANYL CITRATE (PF) 100 MCG/2ML IJ SOLN
INTRAMUSCULAR | Status: AC
  Filled 2021-01-13: qty 2

## 2021-01-13 MED ORDER — CEFAZOLIN SODIUM-DEXTROSE 2-4 GM/100ML-% IV SOLN
2.0000 g | INTRAVENOUS | Status: AC
  Administered 2021-01-13: 2 g via INTRAVENOUS
  Filled 2021-01-13: qty 100

## 2021-01-13 MED ORDER — ACETAMINOPHEN 10 MG/ML IV SOLN
INTRAVENOUS | Status: AC
  Filled 2021-01-13: qty 100

## 2021-01-13 MED ORDER — LIDOCAINE 2% (20 MG/ML) 5 ML SYRINGE
INTRAMUSCULAR | Status: DC | PRN
  Administered 2021-01-13: 40 mg via INTRAVENOUS

## 2021-01-13 MED ORDER — PROMETHAZINE HCL 25 MG/ML IJ SOLN: 6.2500 mg | INTRAMUSCULAR | Status: DC | PRN

## 2021-01-13 MED ORDER — SODIUM CHLORIDE 0.9 % IR SOLN
Status: DC | PRN
  Administered 2021-01-13: 3000 mL

## 2021-01-13 MED ORDER — LIDOCAINE 2% (20 MG/ML) 5 ML SYRINGE
INTRAMUSCULAR | Status: AC
  Filled 2021-01-13: qty 5

## 2021-01-13 MED ORDER — DEXAMETHASONE SODIUM PHOSPHATE 10 MG/ML IJ SOLN
INTRAMUSCULAR | Status: AC
  Filled 2021-01-13: qty 1

## 2021-01-13 MED ORDER — ACETAMINOPHEN 160 MG/5ML PO SOLN: 325.0000 mg | ORAL | Status: DC | PRN

## 2021-01-13 MED ORDER — ONDANSETRON HCL 4 MG/2ML IJ SOLN
INTRAMUSCULAR | Status: DC | PRN
  Administered 2021-01-13: 4 mg via INTRAVENOUS

## 2021-01-13 MED ORDER — HYDROCODONE-ACETAMINOPHEN 5-325 MG PO TABS: 1.0000 | ORAL_TABLET | Freq: Four times a day (QID) | ORAL | 0 refills | Status: DC | PRN

## 2021-01-13 MED ORDER — DEXAMETHASONE SODIUM PHOSPHATE 10 MG/ML IJ SOLN
INTRAMUSCULAR | Status: DC | PRN
  Administered 2021-01-13: 4 mg via INTRAVENOUS

## 2021-01-13 MED ORDER — PROPOFOL 10 MG/ML IV BOLUS
INTRAVENOUS | Status: AC
  Filled 2021-01-13: qty 20

## 2021-01-13 MED ORDER — AMISULPRIDE (ANTIEMETIC) 5 MG/2ML IV SOLN
10.0000 mg | Freq: Once | INTRAVENOUS | Status: DC | PRN
Start: 2021-01-13 — End: 2021-01-13

## 2021-01-13 MED ORDER — ACETAMINOPHEN 10 MG/ML IV SOLN
1000.0000 mg | Freq: Once | INTRAVENOUS | Status: DC | PRN
  Administered 2021-01-13: 1000 mg via INTRAVENOUS

## 2021-01-13 SURGICAL SUPPLY — 23 items
BAG URO CATCHER STRL LF (MISCELLANEOUS) ×3 IMPLANT
BASKET STONE NCOMPASS (UROLOGICAL SUPPLIES) IMPLANT
CATH URET 5FR 28IN OPEN ENDED (CATHETERS) IMPLANT
CATH URET DUAL LUMEN 6-10FR 50 (CATHETERS) IMPLANT
CLOTH BEACON ORANGE TIMEOUT ST (SAFETY) ×3 IMPLANT
EXTRACTOR STONE NITINOL NGAGE (UROLOGICAL SUPPLIES) ×2 IMPLANT
GLOVE SURG POLYISO LF SZ8 (GLOVE) ×3 IMPLANT
GOWN STRL REUS W/TWL XL LVL3 (GOWN DISPOSABLE) ×3 IMPLANT
GUIDEWIRE STR DUAL SENSOR (WIRE) ×3 IMPLANT
IV NS IRRIG 3000ML ARTHROMATIC (IV SOLUTION) ×3 IMPLANT
KIT TURNOVER KIT A (KITS) ×3 IMPLANT
LASER FIB FLEXIVA PULSE ID 365 (Laser) IMPLANT
LASER FIB FLEXIVA PULSE ID 550 (Laser) IMPLANT
LASER FIB FLEXIVA PULSE ID 910 (Laser) IMPLANT
MANIFOLD NEPTUNE II (INSTRUMENTS) ×3 IMPLANT
PACK CYSTO (CUSTOM PROCEDURE TRAY) ×3 IMPLANT
SHEATH URETERAL 12FRX35CM (MISCELLANEOUS) ×2 IMPLANT
STENT URET 6FRX22 CONTOUR (STENTS) ×2 IMPLANT
TRACTIP FLEXIVA PULS ID 200XHI (Laser) IMPLANT
TRACTIP FLEXIVA PULSE ID 200 (Laser)
TUBING CONNECTING 10 (TUBING) ×2 IMPLANT
TUBING CONNECTING 10' (TUBING) ×1
TUBING UROLOGY SET (TUBING) ×3 IMPLANT

## 2021-01-13 NOTE — Discharge Instructions (Signed)
You may remove the stent on Monday morning by pulling the attached string that is tucked vaginally.   If you don't feel you can do this, please call the office to come have it done.   Please bring your stone to the office so that it can be sent for analysis.

## 2021-01-13 NOTE — Anesthesia Procedure Notes (Signed)
Procedure Name: LMA Insertion Date/Time: 01/13/2021 9:01 AM Performed by: Niel Hummer, CRNA Pre-anesthesia Checklist: Patient identified, Emergency Drugs available, Suction available and Patient being monitored Patient Re-evaluated:Patient Re-evaluated prior to induction Oxygen Delivery Method: Circle system utilized Preoxygenation: Pre-oxygenation with 100% oxygen Induction Type: IV induction LMA: LMA inserted LMA Size: 3.0 Number of attempts: 1 Dental Injury: Teeth and Oropharynx as per pre-operative assessment

## 2021-01-13 NOTE — Anesthesia Postprocedure Evaluation (Signed)
Anesthesia Post Note  Patient: Kristen Cooper  Procedure(s) Performed: CYSTOSCOPY/URETEROSCOPY/REMOVAL OF LEFT RENAL AND LEFT DISTAL STONES/STENT PLACEMENT (Left)     Patient location during evaluation: PACU Anesthesia Type: General Level of consciousness: awake and alert Pain management: pain level controlled Vital Signs Assessment: post-procedure vital signs reviewed and stable Respiratory status: spontaneous breathing, nonlabored ventilation, respiratory function stable and patient connected to nasal cannula oxygen Cardiovascular status: blood pressure returned to baseline and stable Postop Assessment: no apparent nausea or vomiting Anesthetic complications: no   No notable events documented.  Last Vitals:  Vitals:   01/13/21 1030 01/13/21 1045  BP: 112/71 117/73  Pulse: 81 69  Resp: 16 14  Temp:  (!) 36.4 C  SpO2: 99% 100%    Last Pain:  Vitals:   01/13/21 1045  TempSrc:   PainSc: [place] Sacha Radloff

## 2021-01-13 NOTE — Op Note (Signed)
Procedure: 1.  Cystoscopy with left ureteroscopy with left distal ureteral stone extraction.  2.  Left flexible renoscopy with left upper pole stone removal. 3.  Cystoscopy insertion of left double-J stent. 4.  Application of fluoroscopy.  Preop diagnosis: 4 x 6 mm left distal ureteral stone and 2 mm left renal stone.  Postop diagnosis: Same.  Surgeon: Dr. Irine Seal.  Anesthesia: General.  Specimen: Renal and ureteral stones.  Drain: 6 [place] by 22 cm left contour double-J stent.  EBL: None.  Complications: None.  Indications: The patient is a 55 year old female who has a 4 x 6 mm left distal ureteral stone with nonprogression.  She was also noted to have a 2 mm stone in the left upper pole on CT.  On KUB today there was approximately 4 mm calcification in the left upper quadrant that had not been present on the prior CT scan.  Procedure: She was given Ancef.  She was taken operating room where general anesthetic was induced.  She was placed in lithotomy position and fitted with PAS hose.  Her perineum and genitalia were prepped with Betadine solution and she was draped in usual sterile fashion.  Cystoscopy was performed with the 21 [place] scope and 30 degree lens.  There was some meatal narrowing so the obturator was required for initial cystoscopic placement.  Cystoscopy otherwise revealed a normal urethra.  The bladder wall was smooth and pale was without tumor stones or inflammation.  Ureteral orifices were unremarkable.  The left ureteral orifice was cannulated with a sensor wire which was advanced by the stone to the kidney.  The cystoscope was removed and the distal ureter was dilated with a 35 cm digital access sheath 12 French inner core.  This passed easily.  A 6.5 French dual-lumen semirigid ureteroscope was then advanced alongside the wire and the stone was visualized.  It was grasped with an engage basket and was removed intact.  Because of the calcification overlying the  area of the left lower pole and the known 2 mm stone in the upper pole I then passed a single-lumen flexible ureteroscope to the kidney and inspected all of the calyces and her bifid collecting system.  The calcification overlying the lower pole area appeared to be lateral to the collecting system and likely extrarenal.  I did find the 2 mm stone in the upper pole and removed with an engage basket.  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  No other intrarenal lesions were noted.  On removal of the scope she was noted to have some mild mucosal irritation and bleeding from the dilation so it was felt that stenting was indicated.  The cystoscope was reinserted and the wire was placed back to the renal collecting system and a 6 [place] by 22 cm contour double-J stent with tether was advanced the kidney under fluoroscopic guidance.  The wire was removed, leaving a good coil in the kidney and a good coil in the bladder.  The bladder was drained and the cystoscope was removed.  The stent string was tied close to the urethral meatus, trimmed to an appropriate length and tucked vaginally.  She was taken down from lithotomy position, her anesthetic was reversed and she was moved recovery in stable condition.  There were no complications.  She will be given her stone fragments to bring to the office.

## 2021-01-13 NOTE — Transfer of Care (Signed)
Immediate Anesthesia Transfer of Care Note  Patient: Kristen Cooper  Procedure(s) Performed: CYSTOSCOPY/URETEROSCOPY/REMOVAL OF LEFT RENAL AND LEFT DISTAL STONES/STENT PLACEMENT (Left)  Patient Location: PACU  Anesthesia Type:General  Level of Consciousness: awake, alert  and oriented  Airway & Oxygen Therapy: Patient Spontanous Breathing and Patient connected to face mask oxygen  Post-op Assessment: Report given to RN, Post -op Vital signs reviewed and stable and Patient moving all extremities X 4  Post vital signs: Reviewed and stable  Last Vitals:  Vitals Value Taken Time  BP 118/64   Temp    Pulse 81   Resp 8   SpO2 100     Last Pain:  Vitals:   01/13/21 0710  TempSrc: Oral  PainSc: 0-No pain         Complications: No notable events documented.

## 2021-01-13 NOTE — Interval H&P Note (Signed)
History and Physical Interval Note:  She has no pain but some urgency.  I will get a KUB.   01/13/2021 8:17 AM  Kristen Cooper  has presented today for surgery, with the diagnosis of LEFT DISTAL URETERAL STONE POSSIBLE URETHRAL STENOSOS.  The various methods of treatment have been discussed with the patient and family. After consideration of risks, benefits and other options for treatment, the patient has consented to  Procedure(s): CYSTOSCOPY/URETEROSCOPY/HOLMIUM LASER/STENT PLACEMENT POSSIBLE URETHRAL DILATION (Left) as a surgical intervention.  The patient's history has been reviewed, patient examined, no change in status, stable for surgery.  I have reviewed the patient's chart and labs.  Questions were answered to the patient's satisfaction.     Irine Seal

## 2021-01-13 NOTE — Anesthesia Preprocedure Evaluation (Addendum)
Anesthesia Evaluation  Patient identified by MRN, date of birth, ID band Patient awake    Reviewed: Allergy & Precautions, NPO status , Patient's Chart, lab work & pertinent test results  Airway Mallampati: II  TM Distance: >3 FB Neck ROM: Full    Dental  (+) Teeth Intact, Dental Advisory Given   Pulmonary neg pulmonary ROS,    breath sounds clear to auscultation       Cardiovascular hypertension,  Rhythm:Regular Rate:Normal     Neuro/Psych negative psych ROS   GI/Hepatic Neg liver ROS, GERD  ,  Endo/Other  negative endocrine ROS  Renal/GU negative Renal ROS     Musculoskeletal  (+) Arthritis ,   Abdominal Normal abdominal exam  (+)   Peds  Hematology negative hematology ROS (+)   Anesthesia Other Findings   Reproductive/Obstetrics                            Anesthesia Physical Anesthesia Plan  ASA: 2  Anesthesia Plan: General   Post-op Pain Management:    Induction: Intravenous  PONV Risk Score and Plan: 4 or greater and Ondansetron, Dexamethasone, Midazolam and Scopolamine patch - Pre-op  Airway Management Planned: LMA  Additional Equipment: None  Intra-op Plan:   Post-operative Plan: Extubation in OR  Informed Consent: I have reviewed the patients History and Physical, chart, labs and discussed the procedure including the risks, benefits and alternatives for the proposed anesthesia with the patient or authorized representative who has indicated his/her understanding and acceptance.     Dental advisory given  Plan Discussed with: CRNA  Anesthesia Plan Comments:        Anesthesia Quick Evaluation

## 2021-01-14 ENCOUNTER — Encounter (HOSPITAL_COMMUNITY): Payer: Self-pay | Admitting: Urology

## 2021-01-16 ENCOUNTER — Telehealth: Payer: Self-pay

## 2021-01-16 NOTE — Telephone Encounter (Signed)
Patient called and made aware.

## 2021-02-02 ENCOUNTER — Ambulatory Visit (INDEPENDENT_AMBULATORY_CARE_PROVIDER_SITE_OTHER): Payer: BC Managed Care – PPO | Admitting: Urology

## 2021-02-02 ENCOUNTER — Other Ambulatory Visit: Payer: Self-pay

## 2021-02-02 ENCOUNTER — Encounter: Payer: Self-pay | Admitting: Urology

## 2021-02-02 VITALS — BP 107/72 | HR 94

## 2021-02-02 DIAGNOSIS — N3281 Overactive bladder: Secondary | ICD-10-CM | POA: Diagnosis not present

## 2021-02-02 DIAGNOSIS — N200 Calculus of kidney: Secondary | ICD-10-CM | POA: Diagnosis not present

## 2021-02-02 DIAGNOSIS — N201 Calculus of ureter: Secondary | ICD-10-CM

## 2021-02-02 DIAGNOSIS — R3129 Other microscopic hematuria: Secondary | ICD-10-CM | POA: Diagnosis not present

## 2021-02-02 LAB — URINALYSIS, ROUTINE W REFLEX MICROSCOPIC
Bilirubin, UA: NEGATIVE
Glucose, UA: NEGATIVE
Ketones, UA: NEGATIVE
Leukocytes,UA: NEGATIVE
Nitrite, UA: NEGATIVE
Protein,UA: NEGATIVE
Specific Gravity, UA: 1.025 (ref 1.005–1.030)
Urobilinogen, Ur: 0.2 mg/dL (ref 0.2–1.0)
pH, UA: 5.5 (ref 5.0–7.5)

## 2021-02-02 LAB — MICROSCOPIC EXAMINATION
Epithelial Cells (non renal): >10 /HPF — AB (ref 0–10)
Renal Epithel, UA: NONE SEEN /HPF

## 2021-02-02 NOTE — Progress Notes (Signed)

## 2021-02-02 NOTE — Progress Notes (Signed)
Subjective: 1. Microhematuria   2. Renal stones   3. Ureteral stone   4. Overactive bladder     02/02/21: Kristen Cooper returns today in f/t.  She has done well since left ureteroscopy for ureteral and renal stones on 6/24.  She has removed her stent and has no pain or hematuria.  Her UA today has 3-10 RBC.  She brought her stone and that will be sent for analysis.  She remains on Oxybutynin for OAB that may have been related to the distal stone.   01/05/21: Kristen Cooper returns today in f/u.  The CT showed a 5x37m left distal ureteral stone without obstruction and small bilateral renal stones.   She was placed on tamsulosin and remains on Oxybutyinin with improvement in the OAB symptoms.  A KUB prior to this visit shows a stable left distal stone.   She has no pain and has had no gross hematuria.    11/10/20: Kristen Cooper a 55yo female who is sent for evaluation of a sensation of incomplete emptying.  She has frequency and urgency.  She has nocturia x 2 on therapy.  She has some hesitancy.  She has an intermittently slow stream.  She does some postural voiding and crede and can occasionally get more out.  She has no incontinence.  She has no prolapse symptoms.  She has had no gross hematuria but has blood on her dip UA's and has 3-10 RBC's today.  She is G0P0.   She has a history of OAB wet and is on oxybutynin ER 179mdaily.   She had a urethral dilation remotely which helped her symptoms.   She has Crohn's disease.  She saw Dr. JaMichela Pitchern the past.   ROS:  ROS  Allergies  Allergen Reactions   Oatmeal Hives and Itching    Past Medical History:  Diagnosis Date   Arthritis    [facility] dr reLaural Golden  rheumologist-  dr beAmil Amen dx 1987   Dermatography 08/2012   To many histamine cells   GERD (gastroesophageal reflux disease)    History of kidney stones    Hypertension    Inflammatory bowel disease    [place]    Past Surgical History:  Procedure Laterality Date    APPENDECTOMY  1987   COLONOSCOPY N/A 10/27/2015   Procedure: COLONOSCOPY;  Surgeon: NaRogene HoustonMD;  [facility];  Service: Endoscopy;  Laterality: N/A;  11:00   CYSTOSCOPY/URETEROSCOPY/HOLMIUM LASER/STENT PLACEMENT Left 01/13/2021   Procedure: CYSTOSCOPY/URETEROSCOPY/REMOVAL OF LEFT RENAL AND LEFT DISTAL STONES/STENT PLACEMENT;  Surgeon: WrIrine SealMD;  Location: [facility];  Service: Urology;  Laterality: Left;   ESOPHAGEAL DILATION N/A 03/03/2020   Procedure: ESOPHAGEAL DILATION;  Surgeon: ReRogene HoustonMD;  [facility];  Service: Endoscopy;  Laterality: N/A;   ESOPHAGOGASTRODUODENOSCOPY N/A 03/03/2020   Procedure: ESOPHAGOGASTRODUODENOSCOPY (EGD);  Surgeon: ReRogene HoustonMD;  [facility];  Service: Endoscopy;  Laterality: N/A;  1155   MUSCLE BIOPSY Right 01/02/2017   Procedure: UPPER RIGHT  EXTREMITY MUSCLE BIOPSY;  Surgeon: RaRalene OkMD;  [facility]: [facility];  Laterality: Right;   UPPER GASTROINTESTINAL ENDOSCOPY  ?   WISDOM TOOTH EXTRACTION      Social History   Socioeconomic History   Marital status: Married    Spouse name: Not on file   Number of children: Not on file   Years of education: Not on  file   Highest education level: Not on file  Occupational History   Not on file  Tobacco Use   Smoking status: Never   Smokeless tobacco: Never  Vaping Use   Vaping Use: Never used  Substance and Sexual Activity   Alcohol use: No    Alcohol/week: 0.0 standard drinks   Drug use: No   Sexual activity: Not on file  Other Topics Concern   Not on file  Social History Narrative   Not on file   Social Determinants of Health   Financial Resource Strain: Not on file  Food Insecurity: Not on file  Transportation Needs: Not on file  Physical Activity: Not on file  Stress: Not on file  Social Connections: Not on file  Intimate Partner Violence: Not on file    Family History  Problem  Relation Age of Onset   Crohn's disease Mother    Irritable bowel syndrome Father    Hypertension Brother    Healthy Brother    Healthy Brother     Anti-infectives: Anti-infectives (From admission, onward)    None       Current Outpatient Medications  Medication Sig Dispense Refill   acetaminophen (TYLENOL) 500 MG tablet Take 500 mg by mouth every 8 (eight) hours as needed for headache.     cetirizine (ZYRTEC) 10 MG tablet Take 10 mg by mouth at bedtime.      famotidine-calcium carbonate-magnesium hydroxide (PEPCID COMPLETE) 10-800-165 MG chewable tablet Chew 1 tablet by mouth daily as needed (acid reflux).     fexofenadine (ALLEGRA) 180 MG tablet Take 180 mg by mouth daily.      folic acid (FOLVITE) 633 MCG tablet Take 1,600 mcg by mouth daily.      inFLIXimab (REMICADE IV) Inject 5 mg into the vein See admin instructions. IV once every 8 weeks.     lisinopril-hydrochlorothiazide (PRINZIDE,ZESTORETIC) 10-12.5 MG per tablet Take 0.5 tablets by mouth daily.      Methotrexate, PF, 10 MG/0.4ML SOAJ Inject 0.4 mLs into the skin once a week.     Olopatadine HCl (PATADAY) 0.7 % SOLN Place 1 drop into both eyes daily.     oxybutynin (DITROPAN-XL) 10 MG 24 hr tablet Take 10 mg by mouth at bedtime.     polyethylene glycol (MIRALAX / GLYCOLAX) 17 g packet Take 17 g by mouth daily.     Probiotic Product (PROBIOTIC DAILY PO) Take 1 tablet by mouth daily.     Propylene Glycol (SYSTANE BALANCE) 0.6 % SOLN Place 1 application into both eyes daily as needed (Dry eyes).     No current facility-administered medications for this visit.     Objective: Vital signs in last 24 hours: BP 107/72   Pulse 94   LMP 06/03/2016 (Approximate)   Intake/Output from previous day: No intake/output data recorded. Intake/Output this shift: @IOTHISSHIFT @   Physical Exam  Lab Results:  Results for orders placed or performed in visit on 02/02/21 (from the past 24 hour(s))  Urinalysis, Routine w reflex  microscopic     Status: Abnormal   Collection Time: 02/02/21 11:26 AM  Result Value Ref Range   Specific Gravity, UA 1.025 1.005 - 1.030   pH, UA 5.5 5.0 - 7.5   Color, UA Yellow Yellow   Appearance Ur Clear Clear   Leukocytes,UA Negative Negative   Protein,UA Negative Negative/Trace   Glucose, UA Negative Negative   Ketones, UA Negative Negative   RBC, UA 1+ (A) Negative   Bilirubin, UA Negative Negative  Urobilinogen, Ur 0.2 0.2 - 1.0 mg/dL   Nitrite, UA Negative Negative   Microscopic Examination See below:    Narrative   Performed at:  [facility] [address], [place], [place]  [id number] Lab Director: Mina Marble MT, Phone:  [phone]  Microscopic Examination     Status: Abnormal   Collection Time: 02/02/21 11:26 AM   Urine  Result Value Ref Range   WBC, UA 0-5 0 - 5 /hpf   RBC 3-10 (A) 0 - 2 /hpf   Epithelial Cells (non renal) >10 (A) 0 - 10 /hpf   Renal Epithel, UA None seen None seen /hpf   Bacteria, UA Few None seen/Few   Narrative   Performed at:  [facility] [address], [place], [place]  [id number] Lab Director: Stuart, Phone:  [phone]    BMET No results for input(s): NA, K, CL, CO2, GLUCOSE, BUN, CREATININE, CALCIUM in the last 72 hours. PT/INR No results for input(s): LABPROT, INR in the last 72 hours. ABG No results for input(s): PHART, HCO3 in the last 72 hours.  Invalid input(s): PCO2, PO2 . Results for orders placed or performed in visit on 02/02/21 (from the past 24 hour(s))  Urinalysis, Routine w reflex microscopic     Status: Abnormal   Collection Time: 02/02/21 11:26 AM  Result Value Ref Range   Specific Gravity, UA 1.025 1.005 - 1.030   pH, UA 5.5 5.0 - 7.5   Color, UA Yellow Yellow   Appearance Ur Clear Clear   Leukocytes,UA Negative Negative   Protein,UA Negative Negative/Trace   Glucose, UA Negative Negative   Ketones, UA Negative Negative   RBC, UA 1+ (A) Negative   Bilirubin, UA  Negative Negative   Urobilinogen, Ur 0.2 0.2 - 1.0 mg/dL   Nitrite, UA Negative Negative   Microscopic Examination See below:    Narrative   Performed at:  [facility] [address], [place], [place]  [id number] Lab Director: Mina Marble MT, Phone:  [phone]  Microscopic Examination     Status: Abnormal   Collection Time: 02/02/21 11:26 AM   Urine  Result Value Ref Range   WBC, UA 0-5 0 - 5 /hpf   RBC 3-10 (A) 0 - 2 /hpf   Epithelial Cells (non renal) >10 (A) 0 - 10 /hpf   Renal Epithel, UA None seen None seen /hpf   Bacteria, UA Few None seen/Few   Narrative   Performed at:  [facility] [address], [place], [place]  [id number] Lab Director: McKenzie, Phone:  [phone]   UA is unremarkable today.   Outside records and labs reviewed.  Studies/Results: CLINICAL DATA:  Microhematuria.   EXAM: CT ABDOMEN AND PELVIS WITHOUT AND WITH CONTRAST   TECHNIQUE: Multidetector CT imaging of the abdomen and pelvis was performed following the standard protocol before and following the bolus administration of intravenous contrast.   CONTRAST:  175m OMNIPAQUE IOHEXOL 300 MG/ML  SOLN   COMPARISON:  08/20/2004   FINDINGS: Lower chest: Unremarkable   Hepatobiliary: Innumerable tiny hypodensities in the liver parenchyma are again noted, compatible with cyst. Dominant 2.3 cm cyst noted posterior right liver There is no evidence for gallstones, gallbladder wall thickening, or pericholecystic fluid. No intrahepatic or extrahepatic biliary dilation.   Pancreas: No focal mass lesion. No dilatation of the main duct. No intraparenchymal cyst. No peripancreatic edema.   Spleen: No splenomegaly. No focal mass lesion.  Adrenals/Urinary Tract: No adrenal nodule or mass.   Precontrast imaging shows a nonobstructing 1-2 mm interpolar right renal stone with no stones visible in the right ureter no secondary changes in the right kidney or  ureter. 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 Despite the lack of substantial left hydroureteronephrosis, a 5 x 6 x 4 mm stone is identified in the distal left ureter about 15-20 mm proximal to the UVJ (axial precontrast image 60 of series 2 and relationship to the left ureter well demonstrated on delayed coronal image 38 of series 16).   Imaging after IV contrast administration shows no overtly suspicious enhancing mass lesion in either kidney. There does appear to be very subtly decreased perfusion to the left kidney suggesting underlying component of obstructive uropathy.   Delayed imaging shows scattered very tiny hypoattenuating lesions in both kidneys, too small to characterize but likely benign. Delayed post-contrast imaging shows no wall thickening or soft tissue filling defect in either intrarenal collecting system or renal pelvis. Both ureters are well opacified without evidence for wall thickening, soft tissue lesion or focal dilatation. Delayed imaging of the bladder shows no focal wall thickening or mass lesion.   Stomach/Bowel: Stomach is moderately distended with fluid. Duodenum is normally positioned as is the ligament of Treitz. No small bowel wall thickening. No small bowel dilatation. The terminal ileum is normal. The appendix is not well visualized, but there is no edema or inflammation in the region of the cecum. No gross colonic mass. No colonic wall thickening.   Vascular/Lymphatic: No abdominal aortic aneurysm. There is no gastrohepatic or hepatoduodenal ligament lymphadenopathy. No retroperitoneal or mesenteric lymphadenopathy. No pelvic sidewall lymphadenopathy.   Reproductive: The uterus is unremarkable.  There is no adnexal mass.   Other: No intraperitoneal free fluid.   Musculoskeletal: No worrisome lytic or sclerotic osseous abnormality.   IMPRESSION: 1. 5 x 6 x 4  mm distal left ureteral stone without substantial left hydroureteronephrosis. There does appear to be very subtly decreased perfusion to the left kidney suggesting underlying component of obstructive uropathy. 2. Bilateral nonobstructing nephrolithiasis. 3. Tiny hypoattenuating lesions in both kidneys, too small to characterize but likely benign. 4. Innumerable tiny hypodensities in the liver parenchyma, compatible with cysts.  KUB films reviewed.  Report is not back.  Assessment/Plan: Left distal ureteral stone and renal stones.   She is doing well s/p URS and brought her stone for analysis.   I will also get her set up for a litholink.  Her calcium was normal on a BMP.  She will return in 4-6 weeks with a KUB, renal US and litholink results.   Atrophic vaginitis with introital stenosis.  Stay on premarin cream  OAB dry.  She will stop the oxybutynin and see if the symptoms recur. .   No orders of the defined types were placed in this encounter.    Orders Placed This Encounter  Procedures   Microscopic Examination   DG Abd 1 View    Standing Status:   Future    Standing Expiration Date:   02/02/2022    Order Specific Question:   Reason for Exam (SYMPTOM  OR DIAGNOSIS REQUIRED)    Answer:   renal stone    Order Specific Question:   Preferred imaging location?    Answer:   [facility]    Order Specific Question:   Radiology Contrast Protocol - do NOT remove file path  Answer:   [email or web address]    Order Specific Question:   Is patient pregnant?    Answer:   No   US RENAL    Standing Status:   Future    Standing Expiration Date:   02/02/2022    Order Specific Question:   Reason for Exam (SYMPTOM  OR DIAGNOSIS REQUIRED)    Answer:   renal stones    Order Specific Question:   Preferred imaging location?    Answer:   [facility]   Urinalysis, Routine w reflex microscopic   Calculi, with Photograph (to  Clinical Lab)     Return in about 4 weeks (around 03/02/2021) for 4-6 weeks with imaging and litholink results. .    CC: Dr. Consuello Masse    Irine Seal 02/02/2021 [phone]

## 2021-02-07 LAB — CALCULI, WITH PHOTOGRAPH (CLINICAL LAB)
Calcium Oxalate Monohydrate: 100 %
Weight Calculi: 83 mg

## 2021-02-08 NOTE — Progress Notes (Signed)
Results sent via my chart 

## 2021-02-23 ENCOUNTER — Other Ambulatory Visit: Payer: Self-pay

## 2021-03-23 ENCOUNTER — Ambulatory Visit (HOSPITAL_COMMUNITY): Payer: BC Managed Care – PPO

## 2021-03-29 ENCOUNTER — Ambulatory Visit (HOSPITAL_COMMUNITY)
Admission: RE | Admit: 2021-03-29 | Discharge: 2021-03-29 | Disposition: A | Discharge status: Home / Self Care | Payer: BC Managed Care – PPO | Source: Ambulatory Visit | Attending: Urology | Admitting: Urology

## 2021-03-29 ENCOUNTER — Other Ambulatory Visit: Payer: Self-pay

## 2021-03-29 DIAGNOSIS — N200 Calculus of kidney: Secondary | ICD-10-CM | POA: Insufficient documentation

## 2021-03-30 ENCOUNTER — Ambulatory Visit: Payer: BC Managed Care – PPO | Admitting: Urology

## 2021-04-03 NOTE — Progress Notes (Signed)
Results sent via my chart 

## 2021-04-27 ENCOUNTER — Other Ambulatory Visit: Payer: Self-pay

## 2021-04-27 ENCOUNTER — Ambulatory Visit (INDEPENDENT_AMBULATORY_CARE_PROVIDER_SITE_OTHER): Payer: BC Managed Care – PPO | Admitting: Urology

## 2021-04-27 ENCOUNTER — Encounter: Payer: Self-pay | Admitting: Urology

## 2021-04-27 VITALS — BP 104/70 | HR 74 | Temp 98.4°F | Wt 107.0 lb

## 2021-04-27 DIAGNOSIS — N1330 Unspecified hydronephrosis: Secondary | ICD-10-CM

## 2021-04-27 DIAGNOSIS — R82992 Hyperoxaluria: Secondary | ICD-10-CM

## 2021-04-27 DIAGNOSIS — Z87442 Personal history of urinary calculi: Secondary | ICD-10-CM

## 2021-04-27 LAB — URINALYSIS, ROUTINE W REFLEX MICROSCOPIC
Bilirubin, UA: NEGATIVE
Glucose, UA: NEGATIVE
Ketones, UA: NEGATIVE
Nitrite, UA: NEGATIVE
Protein,UA: NEGATIVE
Specific Gravity, UA: 1.025 (ref 1.005–1.030)
Urobilinogen, Ur: 0.2 mg/dL (ref 0.2–1.0)
pH, UA: 5 (ref 5.0–7.5)

## 2021-04-27 LAB — MICROSCOPIC EXAMINATION: Renal Epithel, UA: NONE SEEN /hpf

## 2021-04-27 NOTE — Progress Notes (Signed)
Subjective: 1. History of nephrolithiasis   2. Hydronephrosis, unspecified hydronephrosis type   3. Enteric hyperoxaluria     04/27/21: Kristen Cooper returns today in f/u.  She had a renal US on 03/29/21 that showed no stones but minimal right hydronephrosis.   I comparison to her prior CT hematuria study, the dilation is chronic and non-obstructive.  She has had no flank pain or hematuria.  Her OAB symptoms have resolved as well.  The litholink showed low urine volume and mildly elevated oxalate at 58.   Her stone is Ca Oxalate.   02/02/21: Kristen Cooper returns today in f/t.  She has done well since left ureteroscopy for ureteral and renal stones on 6/24.  She has removed her stent and has no pain or hematuria.  Her UA today has 3-10 RBC.  She brought her stone and that will be sent for analysis.  She remains on Oxybutynin for OAB that may have been related to the distal stone.   01/05/21: Kristen Cooper returns today in f/u.  The CT showed a 5x36m left distal ureteral stone without obstruction and small bilateral renal stones.   She was placed on tamsulosin and remains on Oxybutyinin with improvement in the OAB symptoms.  A KUB prior to this visit shows a stable left distal stone.   She has no pain and has had no gross hematuria.    11/10/20: Kristen Cooper a 55yo female who is sent for evaluation of a sensation of incomplete emptying.  She has frequency and urgency.  She has nocturia x 2 on therapy.  She has some hesitancy.  She has an intermittently slow stream.  She does some postural voiding and crede and can occasionally get more out.  She has no incontinence.  She has no prolapse symptoms.  She has had no gross hematuria but has blood on her dip UA's and has 3-10 RBC's today.  She is G0P0.   She has a history of OAB wet and is on oxybutynin ER 138mdaily.   She had a urethral dilation remotely which helped her symptoms.   She has Crohn's disease.  She saw Dr. JaMichela Pitchern the past.   ROS:  ROS  Allergies  Allergen Reactions    Oatmeal Hives and Itching    Past Medical History:  Diagnosis Date   Arthritis    [facility]/ dr reLaural Golden  rheumologist-  dr beAmil Amen dx 1987   Dermatography 08/2012   To many histamine cells   GERD (gastroesophageal reflux disease)    History of kidney stones    Hypertension    Inflammatory bowel disease    [place] [facility]    Past Surgical History:  Procedure Laterality Date   APPENDECTOMY  1987   COLONOSCOPY N/A 10/27/2015   Procedure: COLONOSCOPY;  Surgeon: NaRogene HoustonMD;  [facility];  Service: Endoscopy;  Laterality: N/A;  11:00   CYSTOSCOPY/URETEROSCOPY/HOLMIUM LASER/STENT PLACEMENT Left 01/13/2021   Procedure: CYSTOSCOPY/URETEROSCOPY/REMOVAL OF LEFT RENAL AND LEFT DISTAL STONES/STENT PLACEMENT;  Surgeon: WrIrine SealMD;  [facility];  Service: Urology;  Laterality: Left;   ESOPHAGEAL DILATION N/A 03/03/2020   Procedure: ESOPHAGEAL DILATION;  Surgeon: ReRogene HoustonMD;  [facility];  Service: Endoscopy;  Laterality: N/A;   ESOPHAGOGASTRODUODENOSCOPY N/A 03/03/2020   Procedure: ESOPHAGOGASTRODUODENOSCOPY (EGD);  Surgeon: ReRogene HoustonMD;  [facility];  Service: Endoscopy;  Laterality: N/A;  1155   MUSCLE BIOPSY Right 01/02/2017  Procedure: UPPER RIGHT  EXTREMITY MUSCLE BIOPSY;  Surgeon: Ralene Ok, MD;  Location: [place];  Service: General;  Laterality: Right;   UPPER GASTROINTESTINAL ENDOSCOPY  ?   WISDOM TOOTH EXTRACTION      Social History   Socioeconomic History   Marital status: Married    Spouse name: Not on file   Number of children: Not on file   Years of education: Not on file   Highest education level: Not on file  Occupational History   Not on file  Tobacco Use   Smoking status: Never   Smokeless tobacco: Never  Vaping Use   Vaping Use: Never used  Substance and Sexual Activity   Alcohol use: No    Alcohol/week: 0.0 standard drinks   Drug  use: No   Sexual activity: Not on file  Other Topics Concern   Not on file  Social History Narrative   Not on file   Social Determinants of Health   Financial Resource Strain: Not on file  Food Insecurity: Not on file  Transportation Needs: Not on file  Physical Activity: Not on file  Stress: Not on file  Social Connections: Not on file  Intimate Partner Violence: Not on file    Family History  Problem Relation Age of Onset   Crohn's disease Mother    Irritable bowel syndrome Father    Hypertension Brother    Healthy Brother    Healthy Brother     Anti-infectives: Anti-infectives (From admission, onward)    None       Current Outpatient Medications  Medication Sig Dispense Refill   acetaminophen (TYLENOL) 500 MG tablet Take 500 mg by mouth every 8 (eight) hours as needed for headache.     cetirizine (ZYRTEC) 10 MG tablet Take 10 mg by mouth at bedtime.      famotidine-calcium carbonate-magnesium hydroxide (PEPCID COMPLETE) 10-800-165 MG chewable tablet Chew 1 tablet by mouth daily as needed (acid reflux).     fexofenadine (ALLEGRA) 180 MG tablet Take 180 mg by mouth daily.      folic acid (FOLVITE) 956 MCG tablet Take 1,600 mcg by mouth daily.      inFLIXimab (REMICADE IV) Inject 5 mg into the vein See admin instructions. IV once every 8 weeks.     lisinopril-hydrochlorothiazide (PRINZIDE,ZESTORETIC) 10-12.5 MG per tablet Take 0.5 tablets by mouth daily.      Methotrexate, PF, 10 MG/0.4ML SOAJ Inject 0.4 mLs into the skin once a week.     Olopatadine HCl (PATADAY) 0.7 % SOLN Place 1 drop into both eyes daily.     oxybutynin (DITROPAN-XL) 10 MG 24 hr tablet Take 10 mg by mouth at bedtime.     Probiotic Product (PROBIOTIC DAILY PO) Take 1 tablet by mouth daily.     Propylene Glycol (SYSTANE BALANCE) 0.6 % SOLN Place 1 application into both eyes daily as needed (Dry eyes).     No current facility-administered medications for this visit.     Objective: Vital signs  in last 24 hours: BP 104/70   Pulse 74   Temp 98.4 F (36.9 C)   Wt 107 lb (48.5 kg)   LMP 06/03/2016 (Approximate)   BMI 18.95 kg/m   Intake/Output from previous day: No intake/output data recorded. Intake/Output this shift: @IOTHISSHIFT @   Physical Exam  Lab Results:  Results for orders placed or performed in visit on 04/27/21 (from the past 24 hour(s))  Urinalysis, Routine w reflex microscopic     Status: Abnormal  Collection Time: 04/27/21 10:21 AM  Result Value Ref Range   Specific Gravity, UA 1.025 1.005 - 1.030   pH, UA 5.0 5.0 - 7.5   Color, UA Yellow Yellow   Appearance Ur Clear Clear   Leukocytes,UA Trace (A) Negative   Protein,UA Negative Negative/Trace   Glucose, UA Negative Negative   Ketones, UA Negative Negative   RBC, UA Trace (A) Negative   Bilirubin, UA Negative Negative   Urobilinogen, Ur 0.2 0.2 - 1.0 mg/dL   Nitrite, UA Negative Negative   Microscopic Examination See below:    Narrative   Performed at:  [facility] [address] [place], [place]  [id number] Lab Director: Mina Marble [place], Phone:  [phone]  Microscopic Examination     Status: Abnormal   Collection Time: 04/27/21 10:21 AM   Urine  Result Value Ref Range   WBC, UA 6-10 (A) 0 - 5 /hpf   RBC 0-2 0 - 2 /hpf   Epithelial Cells (non renal) 0-10 0 - 10 /hpf   Renal Epithel, UA None seen None seen /hpf   Bacteria, UA Few None seen/Few   Narrative   Performed at:  [facility] [address] [place], [place]  [id number] Lab Director: [place], Phone:  [phone]     BMET No results for input(s): NA, K, CL, CO2, GLUCOSE, BUN, CREATININE, CALCIUM in the last 72 hours. PT/INR No results for input(s): LABPROT, INR in the last 72 hours. ABG No results for input(s): PHART, HCO3 in the last 72 hours.  Invalid input(s): PCO2, PO2 . Results for orders placed or performed in visit on 04/27/21 (from the past 24 hour(s))  Urinalysis, Routine w  reflex microscopic     Status: Abnormal   Collection Time: 04/27/21 10:21 AM  Result Value Ref Range   Specific Gravity, UA 1.025 1.005 - 1.030   pH, UA 5.0 5.0 - 7.5   Color, UA Yellow Yellow   Appearance Ur Clear Clear   Leukocytes,UA Trace (A) Negative   Protein,UA Negative Negative/Trace   Glucose, UA Negative Negative   Ketones, UA Negative Negative   RBC, UA Trace (A) Negative   Bilirubin, UA Negative Negative   Urobilinogen, Ur 0.2 0.2 - 1.0 mg/dL   Nitrite, UA Negative Negative   Microscopic Examination See below:    Narrative   Performed at:  [facility] [address], [place], [place]  [id number] Lab Director: Mina Marble MT, Phone:  [phone]  Microscopic Examination     Status: Abnormal   Collection Time: 04/27/21 10:21 AM   Urine  Result Value Ref Range   WBC, UA 6-10 (A) 0 - 5 /hpf   RBC 0-2 0 - 2 /hpf   Epithelial Cells (non renal) 0-10 0 - 10 /hpf   Renal Epithel, UA None seen None seen /hpf   Bacteria, UA Few None seen/Few   Narrative   Performed at:  [facility] [address] [place], [place]  [id number] Lab Director: [place], Phone:  [phone]    UA is unremarkable today.  Litholink and stone analysis reviewed.  Studies/Results: US RENAL  Result Date: 03/30/2021 CLINICAL DATA:  Renal stones EXAM: RENAL / URINARY TRACT ULTRASOUND COMPLETE COMPARISON:  CT 12/09/2020 FINDINGS: Right Kidney: Renal measurements: 10.7 x 3.3 by 5.8 cm = volume: 109.3 mL. Echogenicity within normal limits. Negative for mass. Minimal right hydronephrosis. Left Kidney: Renal measurements: 10.7 x 5.2 x 4.8 cm =  volume: 137. mL. Echogenicity within normal limits. No mass or hydronephrosis visualized. Bladder: Appears normal for degree of bladder distention. Other: None. IMPRESSION: 1. Minimal right hydronephrosis. 2. Otherwise negative renal ultrasound Electronically Signed   By: Donavan Foil M.D.   On: 03/30/2021 23:37     Assessment/Plan: Left distal ureteral stone and renal stones.   She is doing well s/p URS.  Her stone is calcium oxalate.  She has low urine volume and elevated oxalate which is probable aggravated by the crohn's disease.  We discussing increased fluid intake, a low oxalate diet and consideration of calcium supplementation with meals.    Right hydronephrosis.  This was noted as mild on the [place] but on her prior CT there was non-obstructive fullness of the right collecting system.    Atrophic vaginitis with introital stenosis.  Stay on premarin cream  OAB dry.  The symptoms resolved with stone removal.   No orders of the defined types were placed in this encounter.    Orders Placed This Encounter  Procedures   Microscopic Examination   DG Abd 1 View    Standing Status:   Future    Standing Expiration Date:   04/27/2022    Order Specific Question:   Reason for Exam (SYMPTOM  OR DIAGNOSIS REQUIRED)    Answer:   renal stones    Order Specific Question:   Preferred imaging location?    Answer:   [facility]    Order Specific Question:   Radiology Contrast Protocol - do NOT remove file path    Answer:   [email or web address]    Order Specific Question:   Is patient pregnant?    Answer:   Unknown (Please Explain)    Comments:   a year is a long way off.   Urinalysis, Routine w reflex microscopic     Return in about 1 year (around 04/27/2022) for with KUB.    CC: Dr. Consuello Masse    Irine Seal 04/27/2021 [phone]

## 2021-04-27 NOTE — Progress Notes (Signed)
Urological Symptom Review  Patient is experiencing the following symptoms: Get up at night to urinate   Review of Systems  Gastrointestinal (upper)  : Negative for upper GI symptoms  Gastrointestinal (lower) : Negative for lower GI symptoms  Constitutional : Negative for symptoms  Skin: Negative for skin symptoms  Eyes: Negative for eye symptoms  Ear/Nose/Throat : Negative for Ear/Nose/Throat symptoms  Hematologic/Lymphatic: Negative for Hematologic/Lymphatic symptoms  Cardiovascular : Negative for cardiovascular symptoms  Respiratory : Negative for respiratory symptoms  Endocrine: Negative for endocrine symptoms  Musculoskeletal: Negative for musculoskeletal symptoms  Neurological: Negative for neurological symptoms  Psychologic: Depression Anxiety

## 2021-06-08 ENCOUNTER — Ambulatory Visit (INDEPENDENT_AMBULATORY_CARE_PROVIDER_SITE_OTHER): Payer: BC Managed Care – PPO | Admitting: Gastroenterology

## 2021-06-22 ENCOUNTER — Telehealth (INDEPENDENT_AMBULATORY_CARE_PROVIDER_SITE_OTHER): Payer: Self-pay

## 2021-06-22 ENCOUNTER — Encounter (INDEPENDENT_AMBULATORY_CARE_PROVIDER_SITE_OTHER): Payer: Self-pay | Admitting: Gastroenterology

## 2021-06-22 ENCOUNTER — Ambulatory Visit (INDEPENDENT_AMBULATORY_CARE_PROVIDER_SITE_OTHER): Payer: BC Managed Care – PPO | Admitting: Gastroenterology

## 2021-06-22 ENCOUNTER — Other Ambulatory Visit: Payer: Self-pay

## 2021-06-22 ENCOUNTER — Other Ambulatory Visit (INDEPENDENT_AMBULATORY_CARE_PROVIDER_SITE_OTHER): Payer: Self-pay

## 2021-06-22 ENCOUNTER — Encounter (INDEPENDENT_AMBULATORY_CARE_PROVIDER_SITE_OTHER): Payer: Self-pay

## 2021-06-22 VITALS — BP 113/74 | HR 87 | Temp 98.0°F | Ht 62.5 in | Wt 107.2 lb

## 2021-06-22 DIAGNOSIS — K5080 Crohn's disease of both small and large intestine without complications: Secondary | ICD-10-CM

## 2021-06-22 DIAGNOSIS — K2100 Gastro-esophageal reflux disease with esophagitis, without bleeding: Secondary | ICD-10-CM | POA: Diagnosis not present

## 2021-06-22 DIAGNOSIS — K219 Gastro-esophageal reflux disease without esophagitis: Secondary | ICD-10-CM | POA: Insufficient documentation

## 2021-06-22 DIAGNOSIS — K222 Esophageal obstruction: Secondary | ICD-10-CM

## 2021-06-22 DIAGNOSIS — I10 Essential (primary) hypertension: Secondary | ICD-10-CM

## 2021-06-22 MED ORDER — PEG 3350-KCL-NA BICARB-NACL 420 G PO SOLR: 4000.0000 mL | ORAL | 0 refills | Status: DC

## 2021-06-22 NOTE — Patient Instructions (Addendum)
Schedule colonoscopy in January 2022 Perform blood workup Continue infliximab 5 mg per kg every 8 weeks Methotrexate SQ every week per rheumatology Referral to dermatology Ask PCP about flu shot, pneumonia shot, shingles shot, COVID shot and bone scan

## 2021-06-22 NOTE — Progress Notes (Addendum)
[name], M.D. Gastroenterology & Hepatology [facility] [address] [place], [place] [address]  Primary Care Physician: Jalene Mullet, PA-C [place] [address]  I will communicate my assessment and recommendations to the referring MD via EMR.  Problems: Ileocolonic Crohn's disease Dysphagia H/o Schatzki's ring  History of Present Illness: Kristen Cooper is a 55 y.o. female with PMH polymyositis, polyarthritis, GERD complicated by Schatzki's ring and ileocolonic Crohn's disease on infliximab and methotrexate, who presents for follow up of Crohn's disease.  The patient was last seen on 06/06/2020. At that time, the patient was being evaluated for dysphagia for which she was previously dilated. The patient could not tolerate pantoprazole in the past and was switched to famotidine.  States she had a diarrhea transient episode on the weekend as she was eating a lot of food during the holidays. She states the diarrhea resolved after a few days. She states she presented some pain in her R flank which improved on its own. She states this was an isolated episode and overall her symptoms are well controlled. Overall has felt well and was asymptomatic prior to Thanksgiving.  The patient has been on Remicade 5 mg/kg every 8 weeks since 2018, has been compliantly following with the infusion center.  She is also taking Pentasa.  These medications have been provided by [facility] rheumatology (Dr. Amil Amen at [facility] rheumatology).  Upon review of her current medications, methotrexate has been given on a weekly basis.  Has lost a little weight as she has changed her diet intentionally as she has avoided carbs that triggered her reflux in the past. Has only taken Pepcid a couple of times in the last 5 months as her heartburn is very occasional. Has presented some episodes of dysphagia which she describes as very infrequent  so she tries to eat smaller meals in general.  Most recent blood work-up available from 01/11/2021 showed a normal BMP (no liver function test available), CBC with white cell count 7.2, hemoglobin 14.5 and platelets 247.  Next Remicade infusion is on Monday. She states she is concerned as her insurance requested to be switched a infliximab biosimilar. She is not taking Pentasa.  Usually has 1 BM per day.  The patient denies having any nausea, vomiting, fever, chills, hematochezia, melena, hematemesis, abdominal distention, abdominal pain, diarrhea, jaundice, pruritus.  Used prednisone many years ago.  Last flu shot: not interested Last pneumonia shot: never Last Pap smear: due next year Last evaluation by dermatology: never Last zoster vaccine: never Last DEXA scan: had one in the past, does not remember exactly when COVID-19 shot: never  Last EGD: 2021 Normal hypopharynx. - Normal esophagus. - LA Grade A reflux esophagitis with no bleeding. - Widely patent Schatzki ring. - No endoscopic esophageal abnormality to explain patient's dysphagia. Esophagus dilated resulting in small mucosal disruption at proximal esophagus suggestive of a web. - Normal stomach. - Normal duodenal bulb and second portion of the duodenum. - No specimens collected.   Last Colonoscopy: 2017-Erosions in the terminal ileum, erythematous eroded ulcerated mucosa in sigmoid, descending, transverse, ascending, cecum.  6 mm polyp sigmoid.  External hemorrhoids.  Polyp was tubular adenoma.   5-year repeat recommended.  Past Medical History: Past Medical History:  Diagnosis Date   Arthritis    Crohn disease ([place]) GI-- dr setzer/ dr Laural Golden:   rheumologist-  dr Amil Amen   dx 1987   Dermatography 08/2012  To many histamine cells   GERD (gastroesophageal reflux disease)    History of kidney stones    Hypertension    Inflammatory bowel disease    Polymyositis ([place])     Past Surgical History: Past Surgical  History:  Procedure Laterality Date   APPENDECTOMY  1987   COLONOSCOPY N/A 10/27/2015   Procedure: COLONOSCOPY;  Surgeon: Rogene Houston, MD;  [facility];  Service: Endoscopy;  Laterality: N/A;  11:00   CYSTOSCOPY/URETEROSCOPY/HOLMIUM LASER/STENT PLACEMENT Left 01/13/2021   Procedure: CYSTOSCOPY/URETEROSCOPY/REMOVAL OF LEFT RENAL AND LEFT DISTAL STONES/STENT PLACEMENT;  Surgeon: Irine Seal, MD;  [facility];  Service: Urology;  Laterality: Left;   ESOPHAGEAL DILATION N/A 03/03/2020   Procedure: ESOPHAGEAL DILATION;  Surgeon: Rogene Houston, MD;  [facility];  Service: Endoscopy;  Laterality: N/A;   ESOPHAGOGASTRODUODENOSCOPY N/A 03/03/2020   Procedure: ESOPHAGOGASTRODUODENOSCOPY (EGD);  Surgeon: Rogene Houston, MD;  [facility];  Service: Endoscopy;  Laterality: N/A;  1155   MUSCLE BIOPSY Right 01/02/2017   Procedure: UPPER RIGHT  EXTREMITY MUSCLE BIOPSY;  Surgeon: Ralene Ok, MD;  Location: [facility];  Service: General;  Laterality: Right;   UPPER GASTROINTESTINAL ENDOSCOPY  ?   WISDOM TOOTH EXTRACTION      Family History: Family History  Problem Relation Age of Onset   Crohn's disease Mother    Irritable bowel syndrome Father    Hypertension Brother    Healthy Brother    Healthy Brother     Social History: Social History   Tobacco Use  Smoking Status Never  Smokeless Tobacco Never   Social History   Substance and Sexual Activity  Alcohol Use No   Alcohol/week: 0.0 standard drinks   Social History   Substance and Sexual Activity  Drug Use No    Allergies: Allergies  Allergen Reactions   Oatmeal Hives and Itching    Medications: Current Outpatient Medications  Medication Sig Dispense Refill   acetaminophen (TYLENOL) 500 MG tablet Take 500 mg by mouth every 8 (eight) hours as needed for headache.     cetirizine (ZYRTEC) 10 MG tablet Take 10 mg by mouth at bedtime.      famotidine-calcium  carbonate-magnesium hydroxide (PEPCID COMPLETE) 10-800-165 MG chewable tablet Chew 1 tablet by mouth daily as needed (acid reflux).     fexofenadine (ALLEGRA) 180 MG tablet Take 180 mg by mouth daily.      folic acid (FOLVITE) 767 MCG tablet Take 1,600 mcg by mouth daily.      inFLIXimab (REMICADE IV) Inject 5 mg into the vein See admin instructions. IV once every 8 weeks.     lisinopril-hydrochlorothiazide (PRINZIDE,ZESTORETIC) 10-12.5 MG per tablet Take 0.5 tablets by mouth daily.      Methotrexate, PF, 10 MG/0.4ML SOAJ Inject 0.4 mLs into the skin once a week.     Olopatadine HCl (PATADAY) 0.7 % SOLN Place 1 drop into both eyes daily.     Probiotic Product (PROBIOTIC DAILY PO) Take 1 tablet by mouth daily.     Propylene Glycol (SYSTANE BALANCE) 0.6 % SOLN Place 1 application into both eyes daily as needed (Dry eyes).     No current facility-administered medications for this visit.    Review of Systems: GENERAL: negative for malaise, night sweats HEENT: No changes in hearing or vision, no nose bleeds or other nasal problems. NECK: Negative for lumps, goiter, pain and significant neck swelling RESPIRATORY: Negative for cough, wheezing CARDIOVASCULAR: Negative for chest pain, leg swelling,  palpitations, orthopnea GI: SEE HPI MUSCULOSKELETAL: Negative for joint pain or swelling, back pain, and muscle pain. SKIN: Negative for lesions, rash PSYCH: Negative for sleep disturbance, mood disorder and recent psychosocial stressors. HEMATOLOGY Negative for prolonged bleeding, bruising easily, and swollen nodes. ENDOCRINE: Negative for cold or heat intolerance, polyuria, polydipsia and goiter. NEURO: negative for tremor, gait imbalance, syncope and seizures. The remainder of the review of systems is noncontributory.   Physical Exam: BP 113/74 (BP Location: Left Arm, Patient Position: Sitting, Cuff Size: Small)   Pulse 87   Temp 98 F (36.7 C) (Oral)   Ht 5' 2.5" (1.588 m)   Wt 107 lb 3.2 oz  (48.6 kg)   LMP 06/03/2016 (Approximate)   BMI 19.29 kg/m  GENERAL: The patient is AO x3, in no acute distress. HEENT: Head is normocephalic and atraumatic. EOMI are intact. Mouth is well hydrated and without lesions. NECK: Supple. No masses LUNGS: Clear to auscultation. No presence of rhonchi/wheezing/rales. Adequate chest expansion HEART: RRR, normal s1 and s2. ABDOMEN: Soft, nontender, no guarding, no peritoneal signs, and nondistended. BS +. No masses. EXTREMITIES: Without any cyanosis, clubbing, rash, lesions or edema. NEUROLOGIC: AOx3, no focal motor deficit. SKIN: no jaundice, no rashes  Imaging/Labs: as above  I personally reviewed and interpreted the available labs, imaging and endoscopic files.  Impression and Plan: Kristen Cooper is a 55 y.o. female with PMH polymyositis, polyarthritis, GERD complicated by Schatzki's ring and ileocolonic Crohn's disease on infliximab and methotrexate, who presents for follow up of Crohn's disease.  The patient has presented chronic history of Crohn's disease which has been controlled on Remicade.  This medication seems to have achieved clinical remission but it is unclear if she has which endoscopic remission.  Due to this, we will schedule a colonoscopy to determine if treatment goals have been reached with possible biopsies.  For now, she should continue taking infliximab 5 mg/kg every 8 weeks as prior.  I have thorough discussion with the patient but I explained to her that the biosimilars have shown so far similar efficacy compared to the brand infliximab and she should continue at the same dosage and frequency as prior.  She understood and agreed.  We will obtain surveillance labs for her Crohn's disease.  Even though I consider that she is not a high risk for complications requiring dual management with an immunomodulator, she is presenting clear benefit from methotrexate for other autoimmune diseases.  Due to this, we will need to keep an eye on  her liver function test as this can be affected by this medicine.  Regarding her vaccines, I advised her to request to be vaccinated against multiple viruses and for pneumonia, she will discuss this with her primary care.  I will also refer her to dermatology for annual skin screening due to increased risk of skin cancer.  - Schedule colonoscopy in January 2022 - Check CBC, CMP, iron studies, CRP, Mg and Vitamin D - Continue infliximab 5 mg per kg every 8 weeks - Methotrexate SQ every week per rheumatology - Referral to dermatology - Patient to ask PCP about flu shot, pneumonia shot, shingles shot, COVID shot and bone scan - RTC 6 months  All questions were answered.      Harvel Quale, MD Gastroenterology and Hepatology [facility]

## 2021-06-22 NOTE — Telephone Encounter (Signed)
Kristen Cooper, CMA  

## 2021-06-27 ENCOUNTER — Telehealth (INDEPENDENT_AMBULATORY_CARE_PROVIDER_SITE_OTHER): Payer: Self-pay

## 2021-06-27 NOTE — Telephone Encounter (Signed)
Thanks

## 2021-06-27 NOTE — Telephone Encounter (Signed)
FYI: Patient called this am stating Dr.Beekman's office Rheumatologist will be switching her from Remicad to [place].

## 2021-07-03 ENCOUNTER — Other Ambulatory Visit (INDEPENDENT_AMBULATORY_CARE_PROVIDER_SITE_OTHER): Payer: Self-pay | Admitting: *Deleted

## 2021-08-04 ENCOUNTER — Telehealth (INDEPENDENT_AMBULATORY_CARE_PROVIDER_SITE_OTHER): Payer: Self-pay | Admitting: *Deleted

## 2021-08-04 MED ORDER — PEG 3350-KCL-NA BICARB-NACL 420 G PO SOLR: 4000.0000 mL | Freq: Once | ORAL | 0 refills | Status: AC

## 2021-08-04 NOTE — Telephone Encounter (Signed)
Thanks.  Medication sent.

## 2021-08-04 NOTE — Telephone Encounter (Signed)
Patient needs trilyte 

## 2021-08-07 ENCOUNTER — Other Ambulatory Visit: Payer: Self-pay

## 2021-08-07 ENCOUNTER — Other Ambulatory Visit (HOSPITAL_COMMUNITY)
Admission: RE | Admit: 2021-08-07 | Discharge: 2021-08-07 | Disposition: A | Discharge status: Home / Self Care | Payer: BC Managed Care – PPO | Source: Ambulatory Visit | Attending: Gastroenterology | Admitting: Gastroenterology

## 2021-08-07 DIAGNOSIS — K5520 Angiodysplasia of colon without hemorrhage: Secondary | ICD-10-CM | POA: Diagnosis not present

## 2021-08-07 DIAGNOSIS — K219 Gastro-esophageal reflux disease without esophagitis: Secondary | ICD-10-CM | POA: Diagnosis not present

## 2021-08-07 DIAGNOSIS — K5080 Crohn's disease of both small and large intestine without complications: Secondary | ICD-10-CM | POA: Insufficient documentation

## 2021-08-07 DIAGNOSIS — K635 Polyp of colon: Secondary | ICD-10-CM | POA: Diagnosis not present

## 2021-08-07 DIAGNOSIS — Z09 Encounter for follow-up examination after completed treatment for conditions other than malignant neoplasm: Secondary | ICD-10-CM | POA: Diagnosis present

## 2021-08-07 DIAGNOSIS — I10 Essential (primary) hypertension: Secondary | ICD-10-CM | POA: Diagnosis not present

## 2021-08-07 LAB — COMPREHENSIVE METABOLIC PANEL
ALT: 30 U/L (ref 0–44)
AST: 25 U/L (ref 15–41)
Albumin: 4.3 g/dL (ref 3.5–5.0)
Alkaline Phosphatase: 86 U/L (ref 38–126)
Anion gap: 7 (ref 5–15)
BUN: 27 mg/dL — ABNORMAL HIGH (ref 6–20)
CO2: 27 mmol/L (ref 22–32)
Calcium: 9.3 mg/dL (ref 8.9–10.3)
Chloride: 102 mmol/L (ref 98–111)
Creatinine, Ser: 0.8 mg/dL (ref 0.44–1.00)
GFR, Estimated: >60 mL/min (ref >60)
Glucose, Bld: 102 mg/dL — ABNORMAL HIGH (ref 70–99)
Potassium: 3.7 mmol/L (ref 3.5–5.1)
Sodium: 136 mmol/L (ref 135–145)
Total Bilirubin: 1.6 mg/dL — ABNORMAL HIGH (ref 0.3–1.2)
Total Protein: 7.4 g/dL (ref 6.5–8.1)

## 2021-08-07 LAB — CBC WITH DIFFERENTIAL/PLATELET
Abs Immature Granulocytes: 0.01 10*3/uL (ref 0.00–0.07)
Basophils Absolute: 0.1 10*3/uL (ref 0.0–0.1)
Basophils Relative: 1 %
Eosinophils Absolute: 0.1 10*3/uL (ref 0.0–0.5)
Eosinophils Relative: 1 %
HCT: 44.5 % (ref 36.0–46.0)
Hemoglobin: 15 g/dL (ref 12.0–15.0)
Immature Granulocytes: 0 %
Lymphocytes Relative: 33 %
Lymphs Abs: 2.3 10*3/uL (ref 0.7–4.0)
MCH: 32.8 pg (ref 26.0–34.0)
MCHC: 33.7 g/dL (ref 30.0–36.0)
MCV: 97.2 fL (ref 80.0–100.0)
Monocytes Absolute: 0.6 10*3/uL (ref 0.1–1.0)
Monocytes Relative: 8 %
Neutro Abs: 4 10*3/uL (ref 1.7–7.7)
Neutrophils Relative %: 57 %
Platelets: 313 10*3/uL (ref 150–400)
RBC: 4.58 MIL/uL (ref 3.87–5.11)
RDW: 12.3 % (ref 11.5–15.5)
WBC: 7.1 10*3/uL (ref 4.0–10.5)
nRBC: 0 % (ref 0.0–0.2)

## 2021-08-07 LAB — IRON AND TIBC
Iron: 102 ug/dL (ref 28–170)
Saturation Ratios: 21 % (ref 10.4–31.8)
TIBC: 497 ug/dL — ABNORMAL HIGH (ref 250–450)
UIBC: 395 ug/dL

## 2021-08-07 LAB — MAGNESIUM: Magnesium: 2.2 mg/dL (ref 1.7–2.4)

## 2021-08-07 LAB — C-REACTIVE PROTEIN: CRP: 0.6 mg/dL (ref <1.0)

## 2021-08-07 LAB — FERRITIN: Ferritin: 47 ng/mL (ref 11–307)

## 2021-08-07 LAB — VITAMIN D 25 HYDROXY (VIT D DEFICIENCY, FRACTURES): Vit D, 25-Hydroxy: 61.41 ng/mL (ref 30–100)

## 2021-08-09 ENCOUNTER — Other Ambulatory Visit: Payer: Self-pay

## 2021-08-09 ENCOUNTER — Ambulatory Visit (HOSPITAL_COMMUNITY): Payer: BC Managed Care – PPO | Admitting: Anesthesiology

## 2021-08-09 ENCOUNTER — Encounter (HOSPITAL_COMMUNITY)
Admission: RE | Disposition: A | Discharge status: Home / Self Care | Payer: Self-pay | Source: Home / Self Care | Attending: Gastroenterology

## 2021-08-09 ENCOUNTER — Ambulatory Visit (HOSPITAL_COMMUNITY)
Admission: RE | Admit: 2021-08-09 | Discharge: 2021-08-09 | Disposition: A | Discharge status: Home / Self Care | Payer: BC Managed Care – PPO | Attending: Gastroenterology | Admitting: Gastroenterology

## 2021-08-09 ENCOUNTER — Encounter (HOSPITAL_COMMUNITY): Payer: Self-pay | Admitting: Gastroenterology

## 2021-08-09 DIAGNOSIS — K5080 Crohn's disease of both small and large intestine without complications: Secondary | ICD-10-CM | POA: Diagnosis not present

## 2021-08-09 DIAGNOSIS — I10 Essential (primary) hypertension: Secondary | ICD-10-CM | POA: Insufficient documentation

## 2021-08-09 DIAGNOSIS — K5520 Angiodysplasia of colon without hemorrhage: Secondary | ICD-10-CM | POA: Diagnosis not present

## 2021-08-09 DIAGNOSIS — K635 Polyp of colon: Secondary | ICD-10-CM | POA: Diagnosis not present

## 2021-08-09 DIAGNOSIS — K219 Gastro-esophageal reflux disease without esophagitis: Secondary | ICD-10-CM | POA: Insufficient documentation

## 2021-08-09 DIAGNOSIS — Z09 Encounter for follow-up examination after completed treatment for conditions other than malignant neoplasm: Secondary | ICD-10-CM | POA: Diagnosis not present

## 2021-08-09 HISTORY — PX: BIOPSY: SHX5522

## 2021-08-09 HISTORY — PX: POLYPECTOMY: SHX5525

## 2021-08-09 HISTORY — PX: COLONOSCOPY WITH PROPOFOL: SHX5780

## 2021-08-09 LAB — HM COLONOSCOPY

## 2021-08-09 SURGERY — COLONOSCOPY WITH PROPOFOL
Anesthesia: General

## 2021-08-09 MED ORDER — PROPOFOL 500 MG/50ML IV EMUL
INTRAVENOUS | Status: DC | PRN
  Administered 2021-08-09: 160 ug/kg/min via INTRAVENOUS

## 2021-08-09 MED ORDER — LACTATED RINGERS IV SOLN: INTRAVENOUS | Status: DC

## 2021-08-09 MED ORDER — PROPOFOL 10 MG/ML IV BOLUS
INTRAVENOUS | Status: DC | PRN
Start: 2021-08-09 — End: 2021-08-09
  Administered 2021-08-09: 60 mg via INTRAVENOUS

## 2021-08-09 MED ORDER — PROPOFOL 500 MG/50ML IV EMUL
INTRAVENOUS | Status: AC
  Filled 2021-08-09: qty 50

## 2021-08-09 NOTE — Discharge Instructions (Addendum)
You are being discharged to home.  Resume your previous diet.  We are waiting for your pathology results.  Your physician has recommended a repeat colonoscopy in two years for surveillance.  Continue infliximab 5 mg/kg every 8 hours.

## 2021-08-09 NOTE — Anesthesia Preprocedure Evaluation (Signed)
Anesthesia Evaluation  Patient identified by MRN, date of birth, ID band Patient awake    Reviewed: Allergy & Precautions, NPO status , Patient's Chart, lab work & pertinent test results  Airway Mallampati: II  TM Distance: >3 FB Neck ROM: Full    Dental  (+) Teeth Intact, Dental Advisory Given   Pulmonary neg pulmonary ROS,    breath sounds clear to auscultation       Cardiovascular hypertension, negative cardio ROS   Rhythm:Regular Rate:Normal     Neuro/Psych  Neuromuscular disease (polymyositis) negative psych ROS   GI/Hepatic Neg liver ROS, GERD  ,  Endo/Other  negative endocrine ROS  Renal/GU negative Renal ROS     Musculoskeletal  (+) Arthritis , Osteoarthritis,    Abdominal Normal abdominal exam  (+)   Peds  Hematology negative hematology ROS (+)   Anesthesia Other Findings   Reproductive/Obstetrics                             Anesthesia Physical  Anesthesia Plan  ASA: 2  Anesthesia Plan: General   Post-op Pain Management:    Induction: Intravenous  PONV Risk Score and Plan: 3 and TIVA, Dexamethasone and Ondansetron  Airway Management Planned: Natural Airway and Nasal Cannula  Additional Equipment:   Intra-op Plan:   Post-operative Plan:   Informed Consent: I have reviewed the patients History and Physical, chart, labs and discussed the procedure including the risks, benefits and alternatives for the proposed anesthesia with the patient or authorized representative who has indicated his/her understanding and acceptance.     Dental advisory given  Plan Discussed with: CRNA  Anesthesia Plan Comments:         Anesthesia Quick Evaluation

## 2021-08-09 NOTE — Anesthesia Postprocedure Evaluation (Signed)
Anesthesia Post Note  Patient: Kristen Cooper  Procedure(s) Performed: COLONOSCOPY WITH PROPOFOL POLYPECTOMY BIOPSY  Patient location during evaluation: Endoscopy Anesthesia Type: General Level of consciousness: awake and alert Pain management: pain level controlled Vital Signs Assessment: post-procedure vital signs reviewed and stable Respiratory status: spontaneous breathing, nonlabored ventilation, respiratory function stable and patient connected to nasal cannula oxygen Cardiovascular status: blood pressure returned to baseline and stable Postop Assessment: no apparent nausea or vomiting Anesthetic complications: no   No notable events documented.   Last Vitals:  Vitals:   08/09/21 0702 08/09/21 0900  BP: 107/66 (!) 98/57  Pulse: (!) 110 88  Resp: 18 14  Temp: 36.9 C (!) 36.4 C  SpO2: 99% 96%    Last Pain:  Vitals:   08/09/21 0900  TempSrc: Oral  PainSc: 0-No pain                 [name]

## 2021-08-09 NOTE — Transfer of Care (Signed)
Immediate Anesthesia Transfer of Care Note  Patient: Kristen Cooper  Procedure(s) Performed: COLONOSCOPY WITH PROPOFOL POLYPECTOMY BIOPSY  Patient Location: PACU  Anesthesia Type:General  Level of Consciousness: awake, alert  and oriented  Airway & Oxygen Therapy: Patient Spontanous Breathing  Post-op Assessment: Report given to RN, Post -op Vital signs reviewed and stable, Patient moving all extremities X 4 and Patient able to stick tongue midline  Post vital signs: Reviewed  Last Vitals:  Vitals Value Taken Time  BP 99/56   Temp 98.6   Pulse 91   Resp 30   SpO2 95     Last Pain:  Vitals:   08/09/21 0827  TempSrc:   PainSc: 0-No pain      Patients Stated Pain Goal: 9 (54/98/26 4158)  Complications: No notable events documented.

## 2021-08-09 NOTE — Op Note (Signed)
[facility] Patient Name: Kristen Cooper Procedure Date: 08/09/2021 8:09 AM MRN: [id number] Date of Birth: 10-07-1965 Attending MD: [name] ,  CSN: [id number] Age: 56 Admit Type: Outpatient Procedure:                Colonoscopy Indications:              Follow-up of Crohn's disease of the small bowel and                            colon Providers:                [name], Lambert Mody, Aram Candela Referring MD:              Medicines:                Monitored Anesthesia Care Complications:            No immediate complications. Estimated Blood Loss:     Estimated blood loss: none. Procedure:                Pre-Anesthesia Assessment:                           - Prior to the procedure, a History and Physical                            was performed, and patient medications, allergies                            and sensitivities were reviewed. The patient's                            tolerance of previous anesthesia was reviewed.                           - The risks and benefits of the procedure and the                            sedation options and risks were discussed with the                            patient. All questions were answered and informed                            consent was obtained.                           - ASA Grade Assessment: III - A patient with severe                            systemic disease.                           After obtaining informed consent, the colonoscope                            was passed under direct vision. Throughout the  procedure, the patient's blood pressure, pulse, and                            oxygen saturations were monitored continuously. The                            [id number]-HQ190L ([id number]) scope was introduced through                            the anus and advanced to the the cecum, identified                            by appendiceal orifice and ileocecal valve. The                             colonoscopy was performed without difficulty. The                            patient tolerated the procedure well. The quality                            of the bowel preparation was excellent. Scope In: 8:27:11 AM Scope Out: 8:57:54 AM Scope Withdrawal Time: 0 hours 23 minutes 56 seconds  Total Procedure Duration: 0 hours 30 minutes 43 seconds  Findings:      The perianal and digital rectal examinations were normal.      The terminal ileum appeared normal. Biopsies were taken with a cold       forceps for histology.      The Simple Endoscopic Score for Crohn's Disease was determined based on       the endoscopic appearance of the mucosa in the following segments:      - Ileum: Findings include no ulcers present, no ulcerated surfaces, no       affected surfaces, no narrowings and no ulcers present, no ulcerated       surfaces, no affected surfaces and no narrowings. Segment score: 0.      - Right Colon: Findings include no ulcers present, no ulcerated       surfaces, no affected surfaces, no narrowings and no ulcers present, no       ulcerated surfaces, no affected surfaces and no narrowings. Segment       score: 0.      - Transverse Colon: Findings include no ulcers present, no ulcerated       surfaces, no affected surfaces, no narrowings and no ulcers present, no       ulcerated surfaces, no affected surfaces and no narrowings. Segment       score: 0.      - Left Colon: Findings include no ulcers present, no ulcerated surfaces,       no affected surfaces, no narrowings and no ulcers present, no ulcerated       surfaces, no affected surfaces and no narrowings. Segment score: 0.      - Rectum: Findings include no ulcers present, no ulcerated surfaces, no       affected surfaces, no narrowings and no ulcers present, no ulcerated       surfaces, no affected surfaces and no narrowings. Segment score: 0.      -  Total SES-CD aggregate score: 0. Biopsies were taken from the  cecum       with a cold forceps for histology. Imaging was performed using white       light and narrow band imaging to visualize the mucosa.      A 4 mm polyp was found in the cecum. The polyp was flat. The polyp was       removed with a cold snare. Resection and retrieval were complete.      A single medium-sized localized angiodysplastic lesion without bleeding       was found in the cecum.      The retroflexed view of the distal rectum and anal verge was normal and       showed no anal or rectal abnormalities. Impression:               - The examined portion of the ileum was normal.                            Biopsied.                           - Simple Endoscopic Score for Crohn's Disease: 0,                            mucosal inflammatory changes secondary to Crohn's                            disease, in remission. Biopsied.                           - One 4 mm polyp in the cecum, removed with a cold                            snare. Resected and retrieved.                           - A single non-bleeding colonic angiodysplastic                            lesion.                           - The distal rectum and anal verge are normal on                            retroflexion view. Moderate Sedation:      Per Anesthesia Care Recommendation:           - Discharge patient to home (ambulatory).                           - Resume previous diet.                           - Await pathology results.                           - Repeat colonoscopy in 2 years for surveillance.                           -  Continue infliximab 5 mg/kg every 8 hours. Procedure Code(s):        --- Professional ---                           [phone], Colonoscopy, flexible; with removal of                            tumor(s), polyp(s), or other lesion(s) by snare                            technique                           45380, 90, Colonoscopy, flexible; with biopsy,                            single or  multiple Diagnosis Code(s):        --- Professional ---                           K63.5, Polyp of colon                           K55.20, Angiodysplasia of colon without hemorrhage                           K50.80, Crohn's disease of both small and large                            intestine without complications CPT copyright 2019 American Medical Association. All rights reserved. The codes documented in this report are preliminary and upon coder review may  be revised to meet current compliance requirements. [name], MD [name],  08/09/2021 9:03:59 AM This report has been signed electronically. Number of Addenda: 0

## 2021-08-09 NOTE — H&P (Signed)
[name] is an 56 y.o. female.   Chief Complaint: Crohn's disease HPI: Kristen Cooper is a 56 y.o. female with PMH polymyositis, polyarthritis, GERD complicated by Schatzki's ring and ileocolonic Crohn's disease on infliximab and methotrexate, who presents for follow up of Crohn's disease.  Patient denies having any complaints.  Has been having normal bowel movements 1 times a day.  Denies any complaints such as nausea, vomiting, fever, chills, hematochezia, melena, hematemesis, abdominal distention, abdominal pain, diarrhea, jaundice, pruritus.   Last Colonoscopy: 2017-Erosions in the terminal ileum, erythematous eroded ulcerated mucosa in sigmoid, descending, transverse, ascending, cecum.  6 mm polyp sigmoid.  External hemorrhoids.  Polyp was tubular adenoma.   5-year repeat recommended.  Past Medical History:  Diagnosis Date   Arthritis    Crohn disease [facility]) GI-- dr setzer/ dr Laural Golden:   rheumologist-  dr Amil Amen   dx 1987   Dermatography 08/2012   To many histamine cells   GERD (gastroesophageal reflux disease)    History of kidney stones    Hypertension    Inflammatory bowel disease    Polymyositis ([place])     Past Surgical History:  Procedure Laterality Date   APPENDECTOMY  1987   COLONOSCOPY N/A 10/27/2015   Procedure: COLONOSCOPY;  Surgeon: Rogene Houston, MD;  Location: [facility];  Service: Endoscopy;  Laterality: N/A;  11:00   CYSTOSCOPY/URETEROSCOPY/HOLMIUM LASER/STENT PLACEMENT Left 01/13/2021   Procedure: CYSTOSCOPY/URETEROSCOPY/REMOVAL OF LEFT RENAL AND LEFT DISTAL STONES/STENT PLACEMENT;  Surgeon: Irine Seal, MD;  Location: [facility];  Service: Urology;  Laterality: Left;   ESOPHAGEAL DILATION N/A 03/03/2020   Procedure: ESOPHAGEAL DILATION;  Surgeon: Rogene Houston, MD;  [facility];  Service: Endoscopy;  Laterality: N/A;   ESOPHAGOGASTRODUODENOSCOPY N/A 03/03/2020   Procedure: ESOPHAGOGASTRODUODENOSCOPY (EGD);  Surgeon: Rogene Houston, MD;  [facility];  Service: Endoscopy;  Laterality: N/A;  1155   MUSCLE BIOPSY Right 01/02/2017   Procedure: UPPER RIGHT  EXTREMITY MUSCLE BIOPSY;  Surgeon: Ralene Ok, MD;  Location: [facility];  Service: General;  Laterality: Right;   UPPER GASTROINTESTINAL ENDOSCOPY  ?   WISDOM TOOTH EXTRACTION      Family History  Problem Relation Age of Onset   Crohn's disease Mother    Irritable bowel syndrome Father    Hypertension Brother    Healthy Brother    Healthy Brother    Social History:  reports that she has never smoked. She has never used smokeless tobacco. She reports that she does not drink alcohol and does not use drugs.  Allergies:  Allergies  Allergen Reactions   Ra Moisturizing Oatmeal [Albolene]     Other reaction(s): hives/oatmeal   Oatmeal Hives and Itching    Medications Prior to Admission  Medication Sig Dispense Refill   cetirizine (ZYRTEC) 10 MG tablet Take 10 mg by mouth at bedtime.      Cholecalciferol (VITAMIN D-3) 125 MCG (5000 UT) TABS Take 5,000 Units by mouth daily.     fexofenadine (ALLEGRA) 180 MG tablet Take 180 mg by mouth daily.      folic acid (FOLVITE) 270 MCG tablet Take 1,600 mcg by mouth daily.      lisinopril-hydrochlorothiazide (PRINZIDE,ZESTORETIC) 10-12.5 MG per tablet Take 0.5 tablets by mouth daily.      Methotrexate, PF, 10 MG/0.4ML SOAJ Inject 0.4 mLs into the skin once a week.     Olopatadine HCl (PATADAY) 0.7 % SOLN Place 1 drop into both eyes daily.  polyethylene glycol-electrolytes (TRILYTE) 420 g solution Take 4,000 mLs by mouth as directed. 4000 mL 0   Probiotic Product (PROBIOTIC DAILY PO) Take 1 tablet by mouth daily.     Propylene Glycol (SYSTANE BALANCE) 0.6 % SOLN Place 1 application into both eyes daily as needed (Dry eyes).     acetaminophen (TYLENOL) 500 MG tablet Take 500 mg by mouth every 8 (eight) hours as needed for headache.     famotidine-calcium carbonate-magnesium hydroxide (PEPCID COMPLETE)  10-800-165 MG chewable tablet Chew 1 tablet by mouth daily as needed (acid reflux).     inFLIXimab (REMICADE IV) Inject 5 mg into the vein See admin instructions. IV once every 8 weeks.      Results for orders placed or performed during the hospital encounter of 08/07/21 (from the past 48 hour(s))  Ferritin     Status: None   Collection Time: 08/07/21 11:31 AM  Result Value Ref Range   Ferritin 47 11 - 307 ng/mL    Comment: Performed at [facility], [address], [place], [place] [address]  Iron and TIBC     Status: Abnormal   Collection Time: 08/07/21 11:31 AM  Result Value Ref Range   Iron 102 28 - 170 ug/dL   TIBC 497 (H) 250 - 450 ug/dL   Saturation Ratios 21 10.4 - 31.8 %   UIBC 395 ug/dL    Comment: Performed at [facility], [address], [place], [place] [address]  C-reactive protein     Status: None   Collection Time: 08/07/21 11:31 AM  Result Value Ref Range   CRP 0.6 <1.0 mg/dL    Comment: Performed at [facility], [place] [address] [place], [place] [address]  Magnesium     Status: None   Collection Time: 08/07/21 11:32 AM  Result Value Ref Range   Magnesium 2.2 1.7 - 2.4 mg/dL    Comment: Performed at [facility], [address] [place], [place] [address]  CBC with Differential/Platelet     Status: None   Collection Time: 08/07/21 11:32 AM  Result Value Ref Range   WBC 7.1 4.0 - 10.5 K/uL   RBC 4.58 3.87 - 5.11 MIL/uL   Hemoglobin 15.0 12.0 - 15.0 g/dL   HCT 44.5 36.0 - 46.0 %   MCV 97.2 80.0 - 100.0 fL   MCH 32.8 26.0 - 34.0 pg   MCHC 33.7 30.0 - 36.0 g/dL   RDW 12.3 11.5 - 15.5 %   Platelets 313 150 - 400 K/uL   nRBC 0.0 0.0 - 0.2 %   Neutrophils Relative % 57 %   Neutro Abs 4.0 1.7 - 7.7 K/uL   Lymphocytes Relative 33 %   Lymphs Abs 2.3 0.7 - 4.0 K/uL   Monocytes Relative 8 %   Monocytes Absolute 0.6 0.1 - 1.0 K/uL   Eosinophils Relative 1 %   Eosinophils Absolute 0.1 0.0 - 0.5 K/uL   Basophils Relative 1 %   Basophils Absolute 0.1 0.0 - 0.1  K/uL   Immature Granulocytes 0 %   Abs Immature Granulocytes 0.01 0.00 - 0.07 K/uL    Comment: Performed at [facility], [address] [place], [place] [address]  Comprehensive metabolic panel     Status: Abnormal   Collection Time: 08/07/21 11:32 AM  Result Value Ref Range   Sodium 136 135 - 145 mmol/L   Potassium 3.7 3.5 - 5.1 mmol/L   Chloride 102 98 - 111 mmol/L   CO2 27 22 -  32 mmol/L   Glucose, Bld 102 (H) 70 - 99 mg/dL    Comment: Glucose reference range applies only to samples taken after fasting for at least 8 hours.   BUN 27 (H) 6 - 20 mg/dL   Creatinine, Ser 0.80 0.44 - 1.00 mg/dL   Calcium 9.3 8.9 - 10.3 mg/dL   Total Protein 7.4 6.5 - 8.1 g/dL   Albumin 4.3 3.5 - 5.0 g/dL   AST 25 15 - 41 U/L   ALT 30 0 - 44 U/L   Alkaline Phosphatase 86 38 - 126 U/L   Total Bilirubin 1.6 (H) 0.3 - 1.2 mg/dL   GFR, Estimated >60 >60 mL/min    Comment: (NOTE) Calculated using the CKD-EPI Creatinine Equation (2021)    Anion gap 7 5 - 15    Comment: Performed at [facility], [address], [place], [place] [address]  VITAMIN D 25 Hydroxy (Vit-D Deficiency, Fractures)     Status: None   Collection Time: 08/07/21 11:32 AM  Result Value Ref Range   Vit D, 25-Hydroxy 61.41 30 - 100 ng/mL    Comment: (NOTE) Vitamin D deficiency has been defined by the [place] practice guideline as a level of serum 25-OH  vitamin D less than 20 ng/mL (1,2). The Endocrine Society went on to  further define vitamin D insufficiency as a level between 21 and 29  ng/mL (2).  1. IOM (Institute of Medicine). 2010. Dietary reference intakes for  calcium and D. [place]: The [employer]. 2. Holick MF, Binkley [place], Bischoff-Ferrari HA, et al. Evaluation,  treatment, and prevention of vitamin D deficiency: an Endocrine  Society clinical practice guideline, JCEM. 2011 Jul; 96(7): 1911-30.  Performed at [facility], [place] [address],  [place],  [address]    No results found.  Review of Systems  Constitutional: Negative.   HENT: Negative.    Eyes: Negative.   Respiratory: Negative.    Cardiovascular: Negative.   Gastrointestinal: Negative.   Endocrine: Negative.   Genitourinary: Negative.   Musculoskeletal: Negative.   Skin: Negative.   Allergic/Immunologic: Negative.   Neurological: Negative.   Hematological: Negative.   Psychiatric/Behavioral: Negative.     Blood pressure 107/66, pulse (!) 110, temperature 98.4 F (36.9 C), temperature source Oral, resp. rate 18, height 5' 2"  (1.575 m), weight 45.4 kg, last menstrual period 06/03/2016, SpO2 99 %. Physical Exam  GENERAL: The patient is AO x3, in no acute distress. HEENT: Head is normocephalic and atraumatic. EOMI are intact. Mouth is well hydrated and without lesions. NECK: Supple. No masses LUNGS: Clear to auscultation. No presence of rhonchi/wheezing/rales. Adequate chest expansion HEART: RRR, normal s1 and s2. ABDOMEN: Soft, nontender, no guarding, no peritoneal signs, and nondistended. BS +. No masses. EXTREMITIES: Without any cyanosis, clubbing, rash, lesions or edema. NEUROLOGIC: AOx3, no focal motor deficit. SKIN: no jaundice, no rashes  Assessment/Plan BRITINEY BLAHNIK is a 56 y.o. female with PMH polymyositis, polyarthritis, GERD complicated by Schatzki's ring and ileocolonic Crohn's disease on infliximab and methotrexate, who presents for follow up of Crohn's disease.  We will proceed with colonoscopy.  Harvel Quale, MD 08/09/2021, 7:30 AM

## 2021-08-10 LAB — SURGICAL PATHOLOGY

## 2021-08-11 ENCOUNTER — Encounter (HOSPITAL_COMMUNITY): Payer: Self-pay | Admitting: Gastroenterology

## 2021-08-14 ENCOUNTER — Encounter (INDEPENDENT_AMBULATORY_CARE_PROVIDER_SITE_OTHER): Payer: Self-pay | Admitting: *Deleted

## 2021-12-25 ENCOUNTER — Encounter (INDEPENDENT_AMBULATORY_CARE_PROVIDER_SITE_OTHER): Payer: Self-pay | Admitting: Gastroenterology

## 2021-12-25 ENCOUNTER — Ambulatory Visit (INDEPENDENT_AMBULATORY_CARE_PROVIDER_SITE_OTHER): Payer: BC Managed Care – PPO | Admitting: Gastroenterology

## 2021-12-25 VITALS — BP 104/67 | HR 85 | Temp 98.1°F | Ht 63.0 in | Wt 108.4 lb

## 2021-12-25 DIAGNOSIS — K5080 Crohn's disease of both small and large intestine without complications: Secondary | ICD-10-CM | POA: Diagnosis not present

## 2021-12-25 NOTE — Progress Notes (Signed)
Kristen Cooper, M.D. Gastroenterology & Hepatology [facility] [address] [place], [place] [address]  Primary Care Physician: Jalene Mullet, PA-C [place] [id number]  I will communicate my assessment and recommendations to the referring MD via EMR.  Problems: Ileocolonic Crohn's disease Dysphagia H/o Schatzki's ring  History of Present Illness: Kristen Cooper is a  56 y.o. female with PMH polymyositis, polyarthritis, GERD complicated by Schatzki's ring and ileocolonic Crohn's disease on infliximab and methotrexate, who presents for follow up of Crohn's disease.  The patient was last seen on 06/22/2021. At that time, the patient was scheduled for colonoscopy which was performed in January 2023 and showed Crohn's disease in remission as described below.  She was found to have low vitamin D levels and was started on vitamin D supplementation 1000 units/day.  She was advised to receive vaccinations in the past.  Patient reports feeling well.  She is currently on Avsola every 8 weeks and MTX 10 mg every week.  Patient reports that she "tends to be more on the constipated side". She states that she gets bloated easily with big meals so she eats small meals 5 times a day. The patient denies having any nausea, vomiting, fever, chills, hematochezia, melena, hematemesis, abdominal distention, abdominal pain, diarrhea, jaundice, pruritus. She has gained a little more weight recently.  Occasionally has a "poking sensation in the back of her eye" but no other EIM.  Last flu shot: not interested Last pneumonia shot: never, not interested Last Pap smear: due next year Last evaluation by dermatology: referred in last appointment , has an appointment tomorrow Last zoster vaccine: never, not interested COVID-19 shot: never, not interested  Last EGD: Last Colonoscopy: 07/2021 The examined portion of the ileum was  normal. Biopsied. - Simple Endoscopic Score for Crohn's Disease: 0, mucosal inflammatory changes secondary to Crohn's disease, in remission. Biopsied. - One 4 mm polyp in the cecum, removed with a cold snare. Resected and retrieved. - A single non-bleeding colonic angiodysplastic lesion. - The distal rectum and anal verge are normal on retroflexion view.  Path: A. COLON, CECAL, POLYPECTOMY:  - Colonic mucosa with prominent submucosal lymphoid aggregate.  No  inflammation, dysplasia or malignancy.  No granulomas are identified.   B. SMALL BOWEL, BIOPSY:  - Small bowel mucosa, no significant abnormality.  No granulomas are  identified.   C. COLON, CECAL, BIOPSY:  - Colonic mucosa, no significant abnormality.  No inflammation,  dysplasia, or metaplasia.  Granulomas are not identified.   Recommended repeat in 2 years  Past Medical History: Past Medical History:  Diagnosis Date   Arthritis    Crohn disease ([place]) GI-- dr setzer/ dr Laural Golden:   rheumologist-  dr Amil Amen   dx 1987   Dermatography 08/2012   To many histamine cells   GERD (gastroesophageal reflux disease)    History of kidney stones    Hypertension    Inflammatory bowel disease    Polymyositis ([place])     Past Surgical History: Past Surgical History:  Procedure Laterality Date   APPENDECTOMY  1987   BIOPSY  08/09/2021   Procedure: BIOPSY;  Surgeon: Harvel Quale, MD;  [facility];  Service: Gastroenterology;;   COLONOSCOPY N/A 10/27/2015   Procedure: COLONOSCOPY;  Surgeon: Rogene Houston, MD;  [facility];  Service: Endoscopy;  Laterality: N/A;  11:00   COLONOSCOPY WITH PROPOFOL N/A 08/09/2021   Procedure: COLONOSCOPY  WITH PROPOFOL;  Surgeon: Montez Morita, Quillian Quince, MD;  [facility];  Service: Gastroenterology;  Laterality: N/A;  8:15   CYSTOSCOPY/URETEROSCOPY/HOLMIUM LASER/STENT PLACEMENT Left 01/13/2021   Procedure: CYSTOSCOPY/URETEROSCOPY/REMOVAL OF LEFT RENAL AND  LEFT DISTAL STONES/STENT PLACEMENT;  Surgeon: Irine Seal, MD;  [facility];  Service: Urology;  Laterality: Left;   ESOPHAGEAL DILATION N/A 03/03/2020   Procedure: ESOPHAGEAL DILATION;  Surgeon: Rogene Houston, MD;  [facility];  Service: Endoscopy;  Laterality: N/A;   ESOPHAGOGASTRODUODENOSCOPY N/A 03/03/2020   Procedure: ESOPHAGOGASTRODUODENOSCOPY (EGD);  Surgeon: Rogene Houston, MD;  [facility];  Service: Endoscopy;  Laterality: N/A;  1155   MUSCLE BIOPSY Right 01/02/2017   Procedure: UPPER RIGHT  EXTREMITY MUSCLE BIOPSY;  Surgeon: Ralene Ok, MD;  Location[facility];  Service: General;  Laterality: Right;   POLYPECTOMY  08/09/2021   Procedure: POLYPECTOMY;  Surgeon: Harvel Quale, MD;  [facility];  Service: Gastroenterology;;   UPPER GASTROINTESTINAL ENDOSCOPY  ?   WISDOM TOOTH EXTRACTION      Family History: Family History  Problem Relation Age of Onset   Crohn's disease Mother    Irritable bowel syndrome Father    Hypertension Brother    Healthy Brother    Healthy Brother     Social History: Social History   Tobacco Use  Smoking Status Never  Smokeless Tobacco Never   Social History   Substance and Sexual Activity  Alcohol Use No   Alcohol/week: 0.0 standard drinks   Social History   Substance and Sexual Activity  Drug Use No    Allergies: Allergies  Allergen Reactions   Ra Moisturizing Oatmeal [Albolene]     Other reaction(s): hives/oatmeal   Oatmeal Hives and Itching    Medications: Current Outpatient Medications  Medication Sig Dispense Refill   acetaminophen (TYLENOL) 500 MG tablet Take 500 mg by mouth every 8 (eight) hours as needed for headache.     cetirizine (ZYRTEC) 10 MG tablet Take 10 mg by mouth at bedtime.      Ergocalciferol (ERGOCAL PO) Take 1,000 mg by mouth daily at 6 (six) AM.     famotidine-calcium carbonate-magnesium hydroxide (PEPCID COMPLETE)  10-800-165 MG chewable tablet Chew 1 tablet by mouth daily as needed (acid reflux).     fexofenadine (ALLEGRA) 180 MG tablet Take 180 mg by mouth daily.      folic acid (FOLVITE) 235 MCG tablet Take 1,600 mcg by mouth daily.      inFLIXimab-axxq (AVSOLA IV) Inject 5 mg into the vein every 8 (eight) weeks. 5 mg per Kg Every 8 weeks.(Gets at [place])     lisinopril-hydrochlorothiazide (PRINZIDE,ZESTORETIC) 10-12.5 MG per tablet Take 0.5 tablets by mouth daily.      Methotrexate, PF, 10 MG/0.4ML SOAJ Inject 0.4 mLs into the skin once a week.     Olopatadine HCl (PATADAY) 0.7 % SOLN Place 1 drop into both eyes daily.     Probiotic Product (PROBIOTIC DAILY PO) Take 1 tablet by mouth daily.     Propylene Glycol (SYSTANE BALANCE) 0.6 % SOLN Place 1 application into both eyes daily as needed (Dry eyes).     inFLIXimab (REMICADE IV) Inject 5 mg into the vein See admin instructions. IV once every 8 weeks.     No current facility-administered medications for this visit.    Review of Systems: GENERAL: negative for malaise, night sweats HEENT: No changes in hearing or vision, no nose bleeds or other nasal problems.  NECK: Negative for lumps, goiter, pain and significant neck swelling RESPIRATORY: Negative for cough, wheezing CARDIOVASCULAR: Negative for chest pain, leg swelling, palpitations, orthopnea GI: SEE HPI MUSCULOSKELETAL: Negative for joint pain or swelling, back pain, and muscle pain. SKIN: Negative for lesions, rash PSYCH: Negative for sleep disturbance, mood disorder and recent psychosocial stressors. HEMATOLOGY Negative for prolonged bleeding, bruising easily, and swollen nodes. ENDOCRINE: Negative for cold or heat intolerance, polyuria, polydipsia and goiter. NEURO: negative for tremor, gait imbalance, syncope and seizures. The remainder of the review of systems is noncontributory.   Physical Exam: BP 104/67 (BP Location: Left Arm, Patient Position: Sitting, Cuff Size:  Small)   Pulse 85   Temp 98.1 F (36.7 C) (Oral)   Ht 5' 3"  (1.6 m)   Wt 108 lb 6.4 oz (49.2 kg)   LMP 06/03/2016 (Approximate)   BMI 19.20 kg/m  GENERAL: The patient is AO x3, in no acute distress. HEENT: Head is normocephalic and atraumatic. EOMI are intact. Mouth is well hydrated and without lesions. NECK: Supple. No masses LUNGS: Clear to auscultation. No presence of rhonchi/wheezing/rales. Adequate chest expansion HEART: RRR, normal s1 and s2. ABDOMEN: Soft, nontender, no guarding, no peritoneal signs, and nondistended. BS +. No masses. EXTREMITIES: Without any cyanosis, clubbing, rash, lesions or edema. NEUROLOGIC: AOx3, no focal motor deficit. SKIN: no jaundice, no rashes  Imaging/Labs: as above  I personally reviewed and interpreted the available labs, imaging and endoscopic files.  Impression and Plan: CHELCEA ZAHN is a  56 y.o. female with PMH polymyositis, polyarthritis, GERD complicated by Schatzki's ring and ileocolonic Crohn's disease on infliximab and methotrexate, who presents for follow up of Crohn's disease.  The patient has achieved both clinical and endoscopic remission while on combination therapy with infliximab 5 mg/kg and methotrexate every week.  Ideally she should be on monotherapy at this point but she is on methotrexate for rheumatologic reasons, will defer management of her myositis to her rheumatologist.  For now, we will monitor her disease with repeat blood work-up including CBC, CMP, CRP, vitamin D levels and fecal calprotectin, will also update her QuantiFERON and hepatitis B serologies.  Patient is not interested in vaccinations.  She will see tomorrow dermatology for skin cancer screening.  - Check CBC, CMP, CRP and Vitamin D - Fecal calprotectin - Continue infliximab 5 mg per kg every 8 weeks - Methotrexate SQ every week per rheumatology - Consider repeat colonoscopy in January 2025 - RTC 1 year  All questions were answered.      Harvel Quale, MD Gastroenterology and Hepatology [facility]

## 2021-12-25 NOTE — Patient Instructions (Signed)
Continue Avsola every 8 weeks Follow up with rheumatology Regarding polymyositis Perform blood workup Perform stool workup

## 2021-12-26 ENCOUNTER — Encounter: Payer: Self-pay | Admitting: Physician Assistant

## 2021-12-26 ENCOUNTER — Ambulatory Visit: Payer: BC Managed Care – PPO | Admitting: Physician Assistant

## 2021-12-26 DIAGNOSIS — K50919 Crohn's disease, unspecified, with unspecified complications: Secondary | ICD-10-CM | POA: Diagnosis not present

## 2021-12-26 DIAGNOSIS — Z1283 Encounter for screening for malignant neoplasm of skin: Secondary | ICD-10-CM | POA: Diagnosis not present

## 2021-12-27 LAB — COMPREHENSIVE METABOLIC PANEL
AG Ratio: 1.8 (calc) (ref 1.0–2.5)
ALT: 25 U/L (ref 6–29)
AST: 24 U/L (ref 10–35)
Albumin: 4.7 g/dL (ref 3.6–5.1)
Alkaline phosphatase (APISO): 90 U/L (ref 37–153)
BUN/Creatinine Ratio: 30 (calc) — ABNORMAL HIGH (ref 6–22)
BUN: 26 mg/dL — ABNORMAL HIGH (ref 7–25)
CO2: 28 mmol/L (ref 20–32)
Calcium: 9.9 mg/dL (ref 8.6–10.4)
Chloride: 101 mmol/L (ref 98–110)
Creat: 0.86 mg/dL (ref 0.50–1.03)
Globulin: 2.6 g/dL (calc) (ref 1.9–3.7)
Glucose, Bld: 97 mg/dL (ref 65–139)
Potassium: 4.1 mmol/L (ref 3.5–5.3)
Sodium: 141 mmol/L (ref 135–146)
Total Bilirubin: 1.3 mg/dL — ABNORMAL HIGH (ref 0.2–1.2)
Total Protein: 7.3 g/dL (ref 6.1–8.1)

## 2021-12-27 LAB — CBC WITH DIFFERENTIAL/PLATELET
Absolute Monocytes: 549 cells/uL (ref 200–950)
Basophils Absolute: 27 cells/uL (ref 0–200)
Basophils Relative: 0.4 %
Eosinophils Absolute: 40 cells/uL (ref 15–500)
Eosinophils Relative: 0.6 %
HCT: 42.7 % (ref 35.0–45.0)
Hemoglobin: 14.4 g/dL (ref 11.7–15.5)
Lymphs Abs: 2184 cells/uL (ref 850–3900)
MCH: 32.1 pg (ref 27.0–33.0)
MCHC: 33.7 g/dL (ref 32.0–36.0)
MCV: 95.3 fL (ref 80.0–100.0)
MPV: 9.4 fL (ref 7.5–12.5)
Monocytes Relative: 8.2 %
Neutro Abs: 3899 cells/uL (ref 1500–7800)
Neutrophils Relative %: 58.2 %
Platelets: 331 10*3/uL (ref 140–400)
RBC: 4.48 10*6/uL (ref 3.80–5.10)
RDW: 12.7 % (ref 11.0–15.0)
Total Lymphocyte: 32.6 %
WBC: 6.7 10*3/uL (ref 3.8–10.8)

## 2021-12-27 LAB — QUANTIFERON-TB GOLD PLUS
Mitogen-NIL: >10 IU/mL
NIL: 0.01 IU/mL
QuantiFERON-TB Gold Plus: NEGATIVE
TB1-NIL: 0 IU/mL
TB2-NIL: 0.01 IU/mL

## 2021-12-27 LAB — VITAMIN D 25 HYDROXY (VIT D DEFICIENCY, FRACTURES): Vit D, 25-Hydroxy: 46 ng/mL (ref 30–100)

## 2021-12-27 LAB — HEPATITIS B SURFACE ANTIGEN: Hepatitis B Surface Ag: NONREACTIVE

## 2021-12-27 LAB — C-REACTIVE PROTEIN: CRP: 0.8 mg/L (ref <8.0)

## 2022-01-01 ENCOUNTER — Encounter: Payer: Self-pay | Admitting: Physician Assistant

## 2022-01-01 NOTE — Progress Notes (Signed)
   New Patient   Subjective  Kristen Cooper is a 56 y.o. female who presents for the following: New Patient (Initial Visit) (Patient here today for skin check, per patient her GI doctor recommended she get a skin check due to medications she's currently taking for Crohn's disease. Per patient no concerns. No personal history or family history of atypical moles, melanoma or non mole skin cancer. ).   The following portions of the chart were reviewed this encounter and updated as appropriate:  Tobacco  Allergies  Meds  Problems  Med Hx  Surg Hx  Fam Hx      Objective  Well appearing patient in no apparent distress; mood and affect are within normal limits.  A full examination was performed including scalp, head, eyes, ears, nose, lips, neck, chest, axillae, abdomen, back, buttocks, bilateral upper extremities, bilateral lower extremities, hands, feet, fingers, toes, fingernails, and toenails. All findings within normal limits unless otherwise noted below.  Full body skin examination- No atypical nevi or signs of NMSC noted at the time of the visit.    Assessment & Plan  Encounter for screening for malignant neoplasm of skin  Yearly skin examination     I, Jera Headings, PA-C, have reviewed all documentation's for this visit.  The documentation on 01/01/22 for the exam, diagnosis, procedures and orders are all accurate and complete.

## 2022-04-26 ENCOUNTER — Ambulatory Visit: Payer: BC Managed Care – PPO | Admitting: Urology

## 2022-04-26 ENCOUNTER — Ambulatory Visit (HOSPITAL_COMMUNITY)
Admission: RE | Admit: 2022-04-26 | Discharge: 2022-04-26 | Disposition: A | Discharge status: Home / Self Care | Payer: BC Managed Care – PPO | Source: Ambulatory Visit | Attending: Urology | Admitting: Urology

## 2022-04-26 ENCOUNTER — Encounter: Payer: Self-pay | Admitting: Urology

## 2022-04-26 VITALS — BP 99/63 | HR 88

## 2022-04-26 DIAGNOSIS — N3281 Overactive bladder: Secondary | ICD-10-CM | POA: Diagnosis not present

## 2022-04-26 DIAGNOSIS — N952 Postmenopausal atrophic vaginitis: Secondary | ICD-10-CM

## 2022-04-26 DIAGNOSIS — Z87442 Personal history of urinary calculi: Secondary | ICD-10-CM | POA: Insufficient documentation

## 2022-04-26 DIAGNOSIS — N200 Calculus of kidney: Secondary | ICD-10-CM | POA: Diagnosis not present

## 2022-04-26 LAB — URINALYSIS, ROUTINE W REFLEX MICROSCOPIC
Bilirubin, UA: NEGATIVE
Glucose, UA: NEGATIVE
Ketones, UA: NEGATIVE
Leukocytes,UA: NEGATIVE
Nitrite, UA: NEGATIVE
Protein,UA: NEGATIVE
Specific Gravity, UA: 1.02 (ref 1.005–1.030)
Urobilinogen, Ur: 0.2 mg/dL (ref 0.2–1.0)
pH, UA: 5.5 (ref 5.0–7.5)

## 2022-04-26 LAB — MICROSCOPIC EXAMINATION

## 2022-04-26 NOTE — Progress Notes (Signed)
Subjective: 1. Renal stones   2. Overactive bladder   3. Vaginal atrophy     04/26/22: Kristen Cooper returns today in f/u for her history of stones with ureteroscopy last year for a left distal stone.  Prior CT showed punctate renal stones.  KUB today shows a 1-24m RLP stone.   UA is unremarkable today.   04/27/21: MDejanirareturns today in f/u.  She had a renal UKoreaon 03/29/21 that showed no stones but minimal right hydronephrosis.   I comparison to her prior CT hematuria study, the dilation is chronic and non-obstructive.  She has had no flank pain or hematuria.  Her OAB symptoms have resolved as well.  The litholink showed low urine volume and mildly elevated oxalate at 58.   Her stone is Ca Oxalate.   02/02/21: MSedaliareturns today in f/t.  She has done well since left ureteroscopy for ureteral and renal stones on 6/24.  She has removed her stent and has no pain or hematuria.  Her UA today has 3-10 RBC.  She brought her stone and that will be sent for analysis.  She remains on Oxybutynin for OAB that may have been related to the distal stone.   01/05/21: MShiasiareturns today in f/u.  The CT showed a 5x642mleft distal ureteral stone without obstruction and small bilateral renal stones.   She was placed on tamsulosin and remains on Oxybutyinin with improvement in the OAB symptoms.  A KUB prior to this visit shows a stable left distal stone.   She has no pain and has had no gross hematuria.    11/10/20: MaFilomenas a [age]o female who is sent for evaluation of a sensation of incomplete emptying.  She has frequency and urgency.  She has nocturia x 2 on therapy.  She has some hesitancy.  She has an intermittently slow stream.  She does some postural voiding and crede and can occasionally get more out.  She has no incontinence.  She has no prolapse symptoms.  She has had no gross hematuria but has blood on her dip UA's and has 3-10 RBC's today.  She is G0P0.   She has a history of OAB wet and is on oxybutynin ER 1034maily.   She  had a urethral dilation remotely which helped her symptoms.   She has Crohn's disease.  She saw Dr. JavMichela Pitcher the past.   ROS:  ROS  Allergies  Allergen Reactions   Ra Moisturizing Oatmeal [Albolene]     Other reaction(s): hives/oatmeal   Oatmeal Hives and Itching    Past Medical History:  Diagnosis Date   Arthritis    [place] [place] [facility]/ dr rehLaural Golden rheumologist-  dr beeAmil Amendx 1987   Dermatography 08/2012   To many histamine cells   GERD (gastroesophageal reflux disease)    History of kidney stones    Hypertension    Inflammatory bowel disease    [facility]   Past Surgical History:  Procedure Laterality Date   APPENDECTOMY  1987   BIOPSY  08/09/2021   Procedure: BIOPSY;  Surgeon: CasHarvel QualeD;  [facility];  Service: Gastroenterology;;   COLONOSCOPY N/A 10/27/2015   Procedure: COLONOSCOPY;  Surgeon: NajRogene HoustonD;  [facility];  Service: Endoscopy;  Laterality: N/A;  11:00   COLONOSCOPY WITH PROPOFOL N/A 08/09/2021   Procedure: COLONOSCOPY WITH PROPOFOL;  Surgeon: CasHarvel QualeD;  [facility];  Service: Gastroenterology;  Laterality: N/A;  8:15   CYSTOSCOPY/URETEROSCOPY/HOLMIUM LASER/STENT PLACEMENT Left 01/13/2021   Procedure: CYSTOSCOPY/URETEROSCOPY/REMOVAL OF LEFT RENAL AND LEFT DISTAL STONES/STENT PLACEMENT;  Surgeon: Irine Seal, MD;  [facility];  Service: Urology;  Laterality: Left;   ESOPHAGEAL DILATION N/A 03/03/2020   Procedure: ESOPHAGEAL DILATION;  Surgeon: Rogene Houston, MD;  [facility];  Service: Endoscopy;  Laterality: N/A;   ESOPHAGOGASTRODUODENOSCOPY N/A 03/03/2020   Procedure: ESOPHAGOGASTRODUODENOSCOPY (EGD);  Surgeon: Rogene Houston, MD;  [facility];  Service: Endoscopy;  Laterality: N/A;  1155   MUSCLE BIOPSY Right 01/02/2017   Procedure: UPPER RIGHT  EXTREMITY MUSCLE BIOPSY;  Surgeon: Ralene Ok, MD;  [facility];  Service: General;  Laterality: Right;   POLYPECTOMY  08/09/2021   Procedure: POLYPECTOMY;  Surgeon: Harvel Quale, MD;  [facility];  Service: Gastroenterology;;   UPPER GASTROINTESTINAL ENDOSCOPY  ?   WISDOM TOOTH EXTRACTION      Social History   Socioeconomic History   Marital status: Married    Spouse name: Not on file   Number of children: Not on file   Years of education: Not on file   Highest education level: Not on file  Occupational History   Not on file  Tobacco Use   Smoking status: Never   Smokeless tobacco: Never  Vaping Use   Vaping Use: Never used  Substance and Sexual Activity   Alcohol use: No    Alcohol/week: 0.0 standard drinks of alcohol   Drug use: No   Sexual activity: Not on file  Other Topics Concern   Not on file  Social History Narrative   Not on file   Social Determinants of Health   Financial Resource Strain: Not on file  Food Insecurity: Not on file  Transportation Needs: Not on file  Physical Activity: Not on file  Stress: Not on file  Social Connections: Not on file  Intimate Partner Violence: Not on file    Family History  Problem Relation Age of Onset   Crohn's disease Mother    Irritable bowel syndrome Father    Hypertension Brother    Healthy Brother    Healthy Brother     Anti-infectives: Anti-infectives (From admission, onward)    None       Current Outpatient Medications  Medication Sig Dispense Refill   acetaminophen (TYLENOL) 500 MG tablet Take 500 mg by mouth every 8 (eight) hours as needed for headache.     B-D TB SYRINGE 1CC/27GX1/2" 27G X 1/2" 1 ML MISC Inject into the skin once a week.     cetirizine (ZYRTEC) 10 MG tablet Take 10 mg by mouth at bedtime.      famotidine-calcium carbonate-magnesium hydroxide (PEPCID COMPLETE) 10-800-165 MG chewable tablet Chew 1 tablet by mouth daily as needed (acid reflux).     fexofenadine (ALLEGRA) 180 MG tablet Take 180  mg by mouth daily.      folic acid (FOLVITE) 979 MCG tablet Take 1,600 mcg by mouth daily.      inFLIXimab-axxq (AVSOLA IV) Inject 5 mg into the vein every 8 (eight) weeks. 5 mg per Kg Every 8 weeks.(Gets at [place] [address])     lisinopril-hydrochlorothiazide (PRINZIDE,ZESTORETIC) 10-12.5 MG per tablet Take 0.5 tablets by mouth daily.      methotrexate 50 MG/2ML injection SMARTSIG:0.4 Milliliter(s) SUB-Q Once a Week     Olopatadine HCl (PATADAY) 0.7 % SOLN Place 1 drop into both eyes  daily.     Probiotic Product (PROBIOTIC DAILY PO) Take 1 tablet by mouth daily.     Propylene Glycol (SYSTANE BALANCE) 0.6 % SOLN Place 1 application into both eyes daily as needed (Dry eyes).     No current facility-administered medications for this visit.     Objective: Vital signs in last 24 hours: BP 99/63   Pulse 88   LMP 06/03/2016 (Approximate)   Intake/Output from previous day: No intake/output data recorded. Intake/Output this shift: @IOTHISSHIFT @   Physical Exam  Lab Results:  No results found for this or any previous visit (from the past 24 hour(s)).    BMET No results for input(s): "NA", "K", "CL", "CO2", "GLUCOSE", "BUN", "CREATININE", "CALCIUM" in the last 72 hours. PT/INR No results for input(s): "LABPROT", "INR" in the last 72 hours. ABG No results for input(s): "PHART", "HCO3" in the last 72 hours.  Invalid input(s): "PCO2", "PO2" . No results found for this or any previous visit (from the past 24 hour(s)).   UA is unremarkable today.    Studies/Results: No results found. KUB today shows a 1-95m stone in the RLP on my review.   Report is pending.   Assessment/Plan: Renal stones.   KUB today show small RLP stone. She is doing well s/p URS.      Atrophic vaginitis with introital stenosis.  She is off of  premarin cream  OAB dry.  The symptoms resolved with stone removal.   No orders of the defined types were placed in this encounter.    Orders Placed This  Encounter  Procedures   DG Abd 1 View    Standing Status:   Future    Standing Expiration Date:   04/27/2023    Order Specific Question:   Reason for Exam (SYMPTOM  OR DIAGNOSIS REQUIRED)    Answer:   renal stone    Order Specific Question:   [facility]    [facility]   Order Specific Question:   Radiology Contrast Protocol - do NOT remove file path    Answer:   [email or web address]    Order Specific Question:   Is patient pregnant?    Answer:   No   Urinalysis, Routine w reflex microscopic     Return in about 1 year (around 04/27/2023).    CC: Dr. PConsuello Masse   JIrine Seal10/11/2021 [phone]

## 2022-05-16 LAB — CALPROTECTIN: Calprotectin: 10 mcg/g

## 2022-06-04 ENCOUNTER — Ambulatory Visit (HOSPITAL_COMMUNITY): Payer: BC Managed Care – PPO | Attending: Internal Medicine | Admitting: Physical Therapy

## 2022-06-04 ENCOUNTER — Encounter (HOSPITAL_COMMUNITY): Payer: Self-pay | Admitting: Physical Therapy

## 2022-06-04 DIAGNOSIS — M6281 Muscle weakness (generalized): Secondary | ICD-10-CM | POA: Insufficient documentation

## 2022-06-04 DIAGNOSIS — R29898 Other symptoms and signs involving the musculoskeletal system: Secondary | ICD-10-CM | POA: Insufficient documentation

## 2022-06-04 DIAGNOSIS — R2689 Other abnormalities of gait and mobility: Secondary | ICD-10-CM | POA: Insufficient documentation

## 2022-06-04 NOTE — Therapy (Signed)
OUTPATIENT PHYSICAL THERAPY LOWER EXTREMITY EVALUATION   Patient Name: Kristen Cooper MRN: [id number] DOB:1966-07-23, 56 y.o., female Today's Date: 06/04/2022   PT End of Session - 06/04/22 0855     Visit Number 1    Number of Visits 4    Date for PT Re-Evaluation 07/02/22    Authorization Type BCBS    PT Start Time 0858    PT Stop Time 0941    PT Time Calculation (min) 43 min    Activity Tolerance Patient tolerated treatment well    Behavior During Therapy [facility] for tasks assessed/performed             Past Medical History:  Diagnosis Date   Arthritis    Crohn disease ([place]) GI-- dr setzer/ dr Laural Golden:   rheumologist-  dr Amil Amen   dx 1987   Dermatography 08/2012   To many histamine cells   GERD (gastroesophageal reflux disease)    History of kidney stones    Hypertension    Inflammatory bowel disease    Polymyositis ([place])    Past Surgical History:  Procedure Laterality Date   APPENDECTOMY  1987   BIOPSY  08/09/2021   Procedure: BIOPSY;  Surgeon: Harvel Quale, MD;  [facility];  Service: Gastroenterology;;   COLONOSCOPY N/A 10/27/2015   Procedure: COLONOSCOPY;  Surgeon: Rogene Houston, MD;  [facility];  Service: Endoscopy;  Laterality: N/A;  11:00   COLONOSCOPY WITH PROPOFOL N/A 08/09/2021   Procedure: COLONOSCOPY WITH PROPOFOL;  Surgeon: Harvel Quale, MD;  [facility];  Service: Gastroenterology;  Laterality: N/A;  8:15   CYSTOSCOPY/URETEROSCOPY/HOLMIUM LASER/STENT PLACEMENT Left 01/13/2021   Procedure: CYSTOSCOPY/URETEROSCOPY/REMOVAL OF LEFT RENAL AND LEFT DISTAL STONES/STENT PLACEMENT;  Surgeon: Irine Seal, MD;  [facility];  Service: Urology;  Laterality: Left;   ESOPHAGEAL DILATION N/A 03/03/2020   Procedure: ESOPHAGEAL DILATION;  Surgeon: Rogene Houston, MD;  [facility];  Service: Endoscopy;  Laterality: N/A;   ESOPHAGOGASTRODUODENOSCOPY N/A 03/03/2020   Procedure:  ESOPHAGOGASTRODUODENOSCOPY (EGD);  Surgeon: Rogene Houston, MD;  [facility];  Service: Endoscopy;  Laterality: N/A;  1155   MUSCLE BIOPSY Right 01/02/2017   Procedure: UPPER RIGHT  EXTREMITY MUSCLE BIOPSY;  Surgeon: Ralene Ok, MD;  Location: [facility];  Service: General;  Laterality: Right;   POLYPECTOMY  08/09/2021   Procedure: POLYPECTOMY;  Surgeon: Harvel Quale, MD;  [facility];  Service: Gastroenterology;;   UPPER GASTROINTESTINAL ENDOSCOPY  ?   WISDOM TOOTH EXTRACTION     Patient Active Problem List   Diagnosis Date Noted   GERD (gastroesophageal reflux disease) 06/22/2021   Schatzki's ring 06/22/2021   Polymyositis ([place]) 03/03/2020   Crohn disease ([place]) 09/21/2013    PCP: Kassie Mends PA-C  REFERRING PROVIDER: Hennie Duos, MD  REFERRING DIAG: polymyositis , leg weakness  THERAPY DIAG:  Muscle weakness (generalized)  Other abnormalities of gait and mobility  Other symptoms and signs involving the musculoskeletal system  Rationale for Evaluation and Treatment: Rehabilitation  ONSET DATE: 2018  SUBJECTIVE:   SUBJECTIVE STATEMENT: Was diagnosed in 2018. Never had PT but her MD suggested she do some PT. Patient with increased difficulty lately. Deep ache in thighs with stairs. Deep ache in shoulders with raking/vacuuming. Walks about 2.5 miles per day.   PERTINENT HISTORY: HTN, polymyositis PAIN:  Are you having pain? No  PRECAUTIONS: None  WEIGHT BEARING RESTRICTIONS: No  FALLS:  Has patient fallen  in last 6 months? Yes. Number of falls 1-2  LIVING ENVIRONMENT: Lives with: lives with their spouse Lives in: House/apartment Stairs: Yes: Internal: 13 to basement steps; on right going up and External: 5 steps; on left going up Has following equipment at home: None  OCCUPATION: Retired  PLOF: Independent  PATIENT GOALS: improve strength, learn how to improve strength  NEXT MD VISIT: March  2024  OBJECTIVE:    COGNITION: Overall cognitive status: Within functional limits for tasks assessed     SENSATION: WFL   POSTURE: rounded shoulders and forward head  PALPATION: No tenderness in legs  LOWER EXTREMITY ROM: WFL for tasks assessed  Active ROM Right eval Left eval  Hip flexion    Hip extension    Hip abduction    Hip adduction    Hip internal rotation    Hip external rotation    Knee flexion    Knee extension    Ankle dorsiflexion    Ankle plantarflexion    Ankle inversion    Ankle eversion     (Blank rows = not tested)  LOWER EXTREMITY MMT:  MMT Right eval Left eval  Hip flexion 4+ 4+  Hip extension 4 4  Hip abduction 4 4  Hip adduction    Hip internal rotation    Hip external rotation    Knee flexion 4+ 4+  Knee extension 4+ 4+  Ankle dorsiflexion 5 5  Ankle plantarflexion    Ankle inversion    Ankle eversion     (Blank rows = not tested)    FUNCTIONAL TESTS:  5 times sit to stand: 8.05 seconds without UE use 2 minute walk test: 450 feet WFL Squat : fair mechanics with shift off L knee with increasing depth Stairs: 7 inch, alternating pattern, WFL, tired/heavy feeling with stairs SLS: >30 seconds bilateral, minimal sway  GAIT: Distance walked: 450 feet Assistive device utilized: None Level of assistance: Complete Independence Comments: 2MWT   TODAY'S TREATMENT:                                                                                                                              DATE:  06/04/22 Sidelying hip abduction 1 x 10 bilateral Prone hip extension 1 x 10 bilateral Bridge 1 x 10 bilateral  Squat 1 x 10     PATIENT EDUCATION:  Education details: Patient educated on exam findings, POC, scope of PT, HEP, and exercise mechanics. Person educated: Patient Education method: Explanation, Demonstration, and Handouts Education comprehension: verbalized understanding, returned demonstration, verbal cues required, and  tactile cues required  HOME EXERCISE PROGRAM: Put into medbridge and give updated handouts next session  ASSESSMENT:  CLINICAL IMPRESSION: Patient a 56 y.o. y.o. female who was seen today for physical therapy evaluation and treatment for polymyositis and leg weakness. Patient presents with pain limited deficits in bilateral LE strength, endurance, activity tolerance, balance and functional mobility with ADL. Patient is having to modify and  restrict ADL as indicated by subjective information and objective measures which is affecting overall participation. Patient will benefit from skilled physical therapy in order to improve function and reduce impairment.   OBJECTIVE IMPAIRMENTS: Abnormal gait, decreased activity tolerance, decreased balance, decreased endurance, decreased mobility, difficulty walking, decreased strength, impaired flexibility, and improper body mechanics.   ACTIVITY LIMITATIONS: carrying, lifting, bending, standing, squatting, stairs, transfers, locomotion level, and caring for others  PARTICIPATION LIMITATIONS: meal prep, cleaning, laundry, shopping, community activity, and yard work  PERSONAL FACTORS: Time since onset of injury/illness/exacerbation and 1-2 comorbidities: polymyositis, HTN  are also affecting patient's functional outcome.   REHAB POTENTIAL: Good  CLINICAL DECISION MAKING: Stable/uncomplicated  EVALUATION COMPLEXITY: Low   GOALS: Goals reviewed with patient? Yes  SHORT TERM GOALS: Target date: 06/18/2022  Patient will be independent with HEP in order to improve functional outcomes. Baseline:  Goal status: INITIAL  2.  Patient will report at least 25% improvement in symptoms for improved quality of life. Baseline:  Goal status: INITIAL    LONG TERM GOALS: Target date: 07/02/2022  Patient will report at least 75% improvement in symptoms for improved quality of life. Baseline:  Goal status: INITIAL  2.  Patient will demonstrate grade of  5/5 MMT grade in all tested musculature as evidence of improved strength to assist with stair ambulation and gait.  Baseline: see above Goal status: INITIAL  3.  Patient will be able transition to self management with HEP and exercise routine to live a healthy lifestyle.  Baseline:  Goal status: INITIAL       PLAN:  PT FREQUENCY: 1x/week  PT DURATION: 4 weeks  PLANNED INTERVENTIONS: Therapeutic exercises, Therapeutic activity, Neuromuscular re-education, Balance training, Gait training, Patient/Family education, Joint manipulation, Joint mobilization, Stair training, Orthotic/Fit training, DME instructions, Aquatic Therapy, Dry Needling, Electrical stimulation, Spinal manipulation, Spinal mobilization, Cryotherapy, Moist heat, Compression bandaging, scar mobilization, Splintting, Taping, Traction, Ultrasound, Ionotophoresis 29m/ml Dexamethasone, and Manual therapy   PLAN FOR NEXT SESSION: f/u with HEP, give handout with exercises from medbridge, advance HEP as able. Functional strength, endurance   AMearl Latin PT 06/04/2022, 9:47 AM

## 2022-06-04 NOTE — Patient Instructions (Signed)
Squat: Half    Arms hanging at sides, squat by dropping hips back as if sitting on a chair. Keep knees over feet. Repeat ____ times per set. Do ____ sets per session. Do ____ sessions per week. Use ____ lb weights.  Copyright  VHI. All rights reserved.  Bridge    Lie back, legs bent. Inhale, pressing hips up. Keeping ribs in, lengthen lower back. Exhale, rolling down along spine from top. Repeat ____ times. Do ____ sessions per day.  http://pm.exer.us/55   Copyright  VHI. All rights reserved.  Prone Hip and Knee Extension    Try to lift operated leg, keeping knee as straight as possible. Do not lift or turn hips. Hold ____ seconds. Repeat ____ times. Do ____ sessions per day.  http://gt2.exer.us/309   Copyright  VHI. All rights reserved.  HIP: Abduction - Side-Lying    Lie on side, legs straight and in line with trunk. Squeeze glutes. Raise top leg up and slightly back. Point toes forward. ___ reps per set, ___ sets per day, ___ days per week Bend bottom leg to stabilize pelvis.  Copyright  VHI. All rights reserved.

## 2022-06-11 ENCOUNTER — Ambulatory Visit (HOSPITAL_COMMUNITY): Payer: BC Managed Care – PPO | Admitting: Physical Therapy

## 2022-06-11 DIAGNOSIS — R2689 Other abnormalities of gait and mobility: Secondary | ICD-10-CM

## 2022-06-11 DIAGNOSIS — M6281 Muscle weakness (generalized): Secondary | ICD-10-CM

## 2022-06-11 NOTE — Therapy (Signed)
OUTPATIENT PHYSICAL THERAPY Treatment  Patient Name: Kristen Cooper MRN: [id number] DOB:1965-07-31, 56 y.o., female Today's Date: 06/11/2022   PT End of Session - 06/11/22 1546     Visit Number 2    Number of Visits 4    Date for PT Re-Evaluation 07/02/22    Authorization Type BCBS    PT Start Time 1548    PT Stop Time 1626    PT Time Calculation (min) 38 min    Activity Tolerance Patient tolerated treatment well    Behavior During Therapy [facility] for tasks assessed/performed             Past Medical History:  Diagnosis Date   Arthritis    Crohn disease ([place]) GI-- dr setzer/ dr Laural Golden:   rheumologist-  dr Amil Amen   dx 1987   Dermatography 08/2012   To many histamine cells   GERD (gastroesophageal reflux disease)    History of kidney stones    Hypertension    Inflammatory bowel disease    Polymyositis ([place])    Past Surgical History:  Procedure Laterality Date   APPENDECTOMY  1987   BIOPSY  08/09/2021   Procedure: BIOPSY;  Surgeon: Harvel Quale, MD;  [facility];  Service: Gastroenterology;;   COLONOSCOPY N/A 10/27/2015   Procedure: COLONOSCOPY;  Surgeon: Rogene Houston, MD;  [facility];  Service: Endoscopy;  Laterality: N/A;  11:00   COLONOSCOPY WITH PROPOFOL N/A 08/09/2021   Procedure: COLONOSCOPY WITH PROPOFOL;  Surgeon: Harvel Quale, MD;  [facility];  Service: Gastroenterology;  Laterality: N/A;  8:15   CYSTOSCOPY/URETEROSCOPY/HOLMIUM LASER/STENT PLACEMENT Left 01/13/2021   Procedure: CYSTOSCOPY/URETEROSCOPY/REMOVAL OF LEFT RENAL AND LEFT DISTAL STONES/STENT PLACEMENT;  Surgeon: Irine Seal, MD;  [facility];  Service: Urology;  Laterality: Left;   ESOPHAGEAL DILATION N/A 03/03/2020   Procedure: ESOPHAGEAL DILATION;  Surgeon: Rogene Houston, MD;  [facility];  Service: Endoscopy;  Laterality: N/A;   ESOPHAGOGASTRODUODENOSCOPY N/A 03/03/2020   Procedure: ESOPHAGOGASTRODUODENOSCOPY (EGD);   Surgeon: Rogene Houston, MD;  [facility];  Service: Endoscopy;  Laterality: N/A;  1155   MUSCLE BIOPSY Right 01/02/2017   Procedure: UPPER RIGHT  EXTREMITY MUSCLE BIOPSY;  Surgeon: Ralene Ok, MD;  Location: [facility];  Service: General;  Laterality: Right;   POLYPECTOMY  08/09/2021   Procedure: POLYPECTOMY;  Surgeon: Harvel Quale, MD;  [facility];  Service: Gastroenterology;;   UPPER GASTROINTESTINAL ENDOSCOPY  ?   WISDOM TOOTH EXTRACTION     Patient Active Problem List   Diagnosis Date Noted   GERD (gastroesophageal reflux disease) 06/22/2021   Schatzki's ring 06/22/2021   Polymyositis ([place]) 03/03/2020   Crohn disease ([place]) 09/21/2013    PCP: Kassie Mends PA-C  REFERRING PROVIDER: Hennie Duos, MD  REFERRING DIAG: polymyositis , leg weakness  THERAPY DIAG:  No diagnosis found.  Rationale for Evaluation and Treatment: Rehabilitation  ONSET DATE: 2018  SUBJECTIVE:   SUBJECTIVE STATEMENT:  PT states she's been helping to pick turnip greens.  States she's not having any pain today, just stiffness in thighs and Upper arms.  Evaluation: Was diagnosed in 2018. Never had PT but her MD suggested she do some PT. Patient with increased difficulty lately. Deep ache in thighs with stairs. Deep ache in shoulders with raking/vacuuming. Walks about 2.5 miles per day.   PERTINENT HISTORY: HTN, polymyositis PAIN:  Are you having pain? No  PRECAUTIONS: None  WEIGHT BEARING  RESTRICTIONS: No  FALLS:  Has patient fallen in last 6 months? Yes. Number of falls 1-2  LIVING ENVIRONMENT: Lives with: lives with their spouse Lives in: House/apartment Stairs: Yes: Internal: 13 to basement steps; on right going up and External: 5 steps; on left going up Has following equipment at home: None  OCCUPATION: Retired  PLOF: Independent  PATIENT GOALS: improve strength, learn how to improve strength  NEXT MD VISIT: March  2024  OBJECTIVE:    COGNITION: Overall cognitive status: Within functional limits for tasks assessed     SENSATION: WFL   POSTURE: rounded shoulders and forward head  PALPATION: No tenderness in legs  LOWER EXTREMITY ROM: WFL for tasks assessed  Active ROM Right eval Left eval  Hip flexion    Hip extension    Hip abduction    Hip adduction    Hip internal rotation    Hip external rotation    Knee flexion    Knee extension    Ankle dorsiflexion    Ankle plantarflexion    Ankle inversion    Ankle eversion     (Blank rows = not tested)  LOWER EXTREMITY MMT:  MMT Right eval Left eval  Hip flexion 4+ 4+  Hip extension 4 4  Hip abduction 4 4  Hip adduction    Hip internal rotation    Hip external rotation    Knee flexion 4+ 4+  Knee extension 4+ 4+  Ankle dorsiflexion 5 5  Ankle plantarflexion    Ankle inversion    Ankle eversion     (Blank rows = not tested)    FUNCTIONAL TESTS:  5 times sit to stand: 8.05 seconds without UE use 2 minute walk test: 450 feet WFL Squat : fair mechanics with shift off L knee with increasing depth Stairs: 7 inch, alternating pattern, WFL, tired/heavy feeling with stairs SLS: >30 seconds bilateral, minimal sway  GAIT: Distance walked: 450 feet Assistive device utilized: None Level of assistance: Complete Independence Comments: 2MWT   TODAY'S TREATMENT:                                                                                                                              DATE:  06/11/22 Standing:   squats 10X  Hip abduction 10X2  Hip extension 10X2  Lunges onto 4" step 10X2  Vectors 5X5" with 1 UE assist Supine:  bridge 2X10  Abdominal stab/isometrics with exercises  SLR 10X Side lying hip abduction 10X each Prone hip extension 10X each  06/04/22 Sidelying hip abduction 1 x 10 bilateral Prone hip extension 1 x 10 bilateral Bridge 1 x 10 bilateral  Squat 1 x 10     PATIENT EDUCATION:  Education  details: Patient educated on exam findings, POC, scope of PT, HEP, and exercise mechanics. Person educated: Patient Education method: Explanation, Demonstration, and Handouts Education comprehension: verbalized understanding, returned demonstration, verbal cues required, and tactile cues required  HOME EXERCISE PROGRAM: Access  Code: [email or web address] URL: [email or web address] Date: 06/11/2022 Prepared by: Roseanne Reno  Exercises - Supine Bridge  - 2 x daily - 7 x weekly - 2 sets - 10 reps - Supine Active Straight Leg Raise  - 2 x daily - 7 x weekly - 2 sets - 10 reps - Sidelying Hip Abduction  - 2 x daily - 7 x weekly - 2 sets - 10 reps - Beginner Prone Single Leg Raise  - 2 x daily - 7 x weekly - 2 sets - 10 reps - Standing Hip Abduction  - 2 x daily - 7 x weekly - 2 sets - 10 reps - Standing Hip Extension  - 2 x daily - 7 x weekly - 2 sets - 10 reps - Mini Squat  - 2 x daily - 7 x weekly - 2 sets - 10 reps  ASSESSMENT:  CLINICAL IMPRESSION: Goals reviewed as well as POC moving forward.  HEP put into medbridge and pt provided with new handouts for added exercises given at this session.  Progressed with standing hip strengthening exercises with general cues for form as to provide with alternative way of completing as well.  Vectors added with noted challenge.  Reviewed mat exercises and educated on importance of core stability. Pt continues to walk daily and is pacing herself better and paying attention to her breathing/fatigue.   Patient will continue to benefit from skilled physical therapy in order to improve function and reduce impairment.   OBJECTIVE IMPAIRMENTS: Abnormal gait, decreased activity tolerance, decreased balance, decreased endurance, decreased mobility, difficulty walking, decreased strength, impaired flexibility, and improper body mechanics.   ACTIVITY LIMITATIONS: carrying, lifting, bending, standing, squatting, stairs, transfers, locomotion level, and caring for  others  PARTICIPATION LIMITATIONS: meal prep, cleaning, laundry, shopping, community activity, and yard work  PERSONAL FACTORS: Time since onset of injury/illness/exacerbation and 1-2 comorbidities: polymyositis, HTN  are also affecting patient's functional outcome.   REHAB POTENTIAL: Good  CLINICAL DECISION MAKING: Stable/uncomplicated  EVALUATION COMPLEXITY: Low   GOALS: Goals reviewed with patient? Yes  SHORT TERM GOALS: Target date: 06/18/2022  Patient will be independent with HEP in order to improve functional outcomes. Baseline:  Goal status: IN PROGRESS  2.  Patient will report at least 25% improvement in symptoms for improved quality of life. Baseline:  Goal status: IN PROGRESS    LONG TERM GOALS: Target date: 07/02/2022  Patient will report at least 75% improvement in symptoms for improved quality of life. Baseline:  Goal status: IN PROGRESS  2.  Patient will demonstrate grade of 5/5 MMT grade in all tested musculature as evidence of improved strength to assist with stair ambulation and gait.  Baseline: see above Goal status: IN PROGRESS  3.  Patient will be able transition to self management with HEP and exercise routine to live a healthy lifestyle.  Baseline:  Goal status: IN PROGRESS       PLAN:  PT FREQUENCY: 1x/week  PT DURATION: 4 weeks  PLANNED INTERVENTIONS: Therapeutic exercises, Therapeutic activity, Neuromuscular re-education, Balance training, Gait training, Patient/Family education, Joint manipulation, Joint mobilization, Stair training, Orthotic/Fit training, DME instructions, Aquatic Therapy, Dry Needling, Electrical stimulation, Spinal manipulation, Spinal mobilization, Cryotherapy, Moist heat, Compression bandaging, scar mobilization, Splintting, Taping, Traction, Ultrasound, Ionotophoresis 38m/ml Dexamethasone, and Manual therapy   PLAN FOR NEXT SESSION: Advance HEP as able. Functional strength, endurance.  Update HEP as  progresses.  ATeena Irani PTA/CLT [facility]: [phone] FTeena Irani PTA 06/11/2022, 4:00  PM

## 2022-06-20 ENCOUNTER — Ambulatory Visit (HOSPITAL_COMMUNITY): Payer: BC Managed Care – PPO | Admitting: Physical Therapy

## 2022-06-20 ENCOUNTER — Encounter (HOSPITAL_COMMUNITY): Payer: Self-pay | Admitting: Physical Therapy

## 2022-06-20 DIAGNOSIS — R29898 Other symptoms and signs involving the musculoskeletal system: Secondary | ICD-10-CM

## 2022-06-20 DIAGNOSIS — M6281 Muscle weakness (generalized): Secondary | ICD-10-CM

## 2022-06-20 DIAGNOSIS — R2689 Other abnormalities of gait and mobility: Secondary | ICD-10-CM

## 2022-06-20 NOTE — Therapy (Signed)
OUTPATIENT PHYSICAL THERAPY Treatment  Patient Name: Kristen Cooper MRN: [id number] DOB:1966/01/18, 56 y.o., female Today's Date: 06/20/2022   PT End of Session - 06/20/22 1424     Visit Number 3    Number of Visits 4    Date for PT Re-Evaluation 07/02/22    Authorization Type BCBS    PT Start Time 1425    PT Stop Time 1505    PT Time Calculation (min) 40 min    Activity Tolerance Patient tolerated treatment well    Behavior During Therapy [facility] for tasks assessed/performed             Past Medical History:  Diagnosis Date   Arthritis    Crohn disease ([place]) GI-- dr setzer/ dr Laural Golden:   rheumologist-  dr Amil Amen   dx 1987   Dermatography 08/2012   To many histamine cells   GERD (gastroesophageal reflux disease)    History of kidney stones    Hypertension    Inflammatory bowel disease    Polymyositis ([place])    Past Surgical History:  Procedure Laterality Date   APPENDECTOMY  1987   BIOPSY  08/09/2021   Procedure: BIOPSY;  Surgeon: Harvel Quale, MD;  [facility];  Service: Gastroenterology;;   COLONOSCOPY N/A 10/27/2015   Procedure: COLONOSCOPY;  Surgeon: Rogene Houston, MD;  [facility];  Service: Endoscopy;  Laterality: N/A;  11:00   COLONOSCOPY WITH PROPOFOL N/A 08/09/2021   Procedure: COLONOSCOPY WITH PROPOFOL;  Surgeon: Harvel Quale, MD;  [facility];  Service: Gastroenterology;  Laterality: N/A;  8:15   CYSTOSCOPY/URETEROSCOPY/HOLMIUM LASER/STENT PLACEMENT Left 01/13/2021   Procedure: CYSTOSCOPY/URETEROSCOPY/REMOVAL OF LEFT RENAL AND LEFT DISTAL STONES/STENT PLACEMENT;  Surgeon: Irine Seal, MD;  [facility];  Service: Urology;  Laterality: Left;   ESOPHAGEAL DILATION N/A 03/03/2020   Procedure: ESOPHAGEAL DILATION;  Surgeon: Rogene Houston, MD;  [facility];  Service: Endoscopy;  Laterality: N/A;   ESOPHAGOGASTRODUODENOSCOPY N/A 03/03/2020   Procedure: ESOPHAGOGASTRODUODENOSCOPY (EGD);   Surgeon: Rogene Houston, MD;  [facility];  Service: Endoscopy;  Laterality: N/A;  1155   MUSCLE BIOPSY Right 01/02/2017   Procedure: UPPER RIGHT  EXTREMITY MUSCLE BIOPSY;  Surgeon: Ralene Ok, MD;  Location: [facility];  Service: General;  Laterality: Right;   POLYPECTOMY  08/09/2021   Procedure: POLYPECTOMY;  Surgeon: Harvel Quale, MD;  [facility];  Service: Gastroenterology;;   UPPER GASTROINTESTINAL ENDOSCOPY  ?   WISDOM TOOTH EXTRACTION     Patient Active Problem List   Diagnosis Date Noted   GERD (gastroesophageal reflux disease) 06/22/2021   Schatzki's ring 06/22/2021   Polymyositis ([place]) 03/03/2020   Crohn disease ([place]) 09/21/2013    PCP: Kassie Mends PA-C  REFERRING PROVIDER: Hennie Duos, MD  REFERRING DIAG: polymyositis , leg weakness  THERAPY DIAG:  Muscle weakness (generalized)  Other abnormalities of gait and mobility  Other symptoms and signs involving the musculoskeletal system  Rationale for Evaluation and Treatment: Rehabilitation  ONSET DATE: 2018  SUBJECTIVE:   SUBJECTIVE STATEMENT:  Feels worn out from going up and down stairs decorating for christmas. Been able to do HEP. Has been able to walk but not progressing as much.  PERTINENT HISTORY: HTN, polymyositis PAIN:  Are you having pain? No  PRECAUTIONS: None  WEIGHT BEARING RESTRICTIONS: No  FALLS:  Has patient fallen in last 6 months? Yes. Number of falls 1-2  LIVING ENVIRONMENT: Lives with:  lives with their spouse Lives in: House/apartment Stairs: Yes: Internal: 13 to basement steps; on right going up and External: 5 steps; on left going up Has following equipment at home: None  OCCUPATION: Retired  PLOF: Independent  PATIENT GOALS: improve strength, learn how to improve strength  NEXT MD VISIT: March 2024  OBJECTIVE:    COGNITION: Overall cognitive status: Within functional limits for tasks  assessed     SENSATION: WFL   POSTURE: rounded shoulders and forward head  PALPATION: No tenderness in legs  LOWER EXTREMITY ROM: WFL for tasks assessed  Active ROM Right eval Left eval  Hip flexion    Hip extension    Hip abduction    Hip adduction    Hip internal rotation    Hip external rotation    Knee flexion    Knee extension    Ankle dorsiflexion    Ankle plantarflexion    Ankle inversion    Ankle eversion     (Blank rows = not tested)  LOWER EXTREMITY MMT:  MMT Right eval Left eval  Hip flexion 4+ 4+  Hip extension 4 4  Hip abduction 4 4  Hip adduction    Hip internal rotation    Hip external rotation    Knee flexion 4+ 4+  Knee extension 4+ 4+  Ankle dorsiflexion 5 5  Ankle plantarflexion    Ankle inversion    Ankle eversion     (Blank rows = not tested)    FUNCTIONAL TESTS:  5 times sit to stand: 8.05 seconds without UE use 2 minute walk test: 450 feet WFL Squat : fair mechanics with shift off L knee with increasing depth Stairs: 7 inch, alternating pattern, WFL, tired/heavy feeling with stairs SLS: >30 seconds bilateral, minimal sway  GAIT: Distance walked: 450 feet Assistive device utilized: None Level of assistance: Complete Independence Comments: 2MWT   TODAY'S TREATMENT:                                                                                                                              DATE:  06/20/22 Squat 1 x 10  Lunge 1 x 10 bilateral with unilateral UE support Lateral stepping 2 x 12 feet bilateral GTB at knees Standing row GTB 2x 20  Standing shoulder extension GTB 2x 10   06/11/22 Standing:   squats 10X  Hip abduction 10X2  Hip extension 10X2  Lunges onto 4" step 10X2  Vectors 5X5" with 1 UE assist Supine:  bridge 2X10  Abdominal stab/isometrics with exercises  SLR 10X Side lying hip abduction 10X each Prone hip extension 10X each  06/04/22 Sidelying hip abduction 1 x 10 bilateral Prone hip extension  1 x 10 bilateral Bridge 1 x 10 bilateral  Squat 1 x 10     PATIENT EDUCATION:  Education details: Patient educated on exam findings, POC, scope of PT, HEP, and exercise mechanics. Person educated: Patient Education method: Explanation, Demonstration, and Handouts Education  comprehension: verbalized understanding, returned demonstration, verbal cues required, and tactile cues required  HOME EXERCISE PROGRAM: Access Code: [email or web address] URL: [email or web address] Date: 06/11/2022 Prepared by: Roseanne Reno  Exercises - Supine Bridge  - 2 x daily - 7 x weekly - 2 sets - 10 reps - Supine Active Straight Leg Raise  - 2 x daily - 7 x weekly - 2 sets - 10 reps - Sidelying Hip Abduction  - 2 x daily - 7 x weekly - 2 sets - 10 reps - Beginner Prone Single Leg Raise  - 2 x daily - 7 x weekly - 2 sets - 10 reps - Standing Hip Abduction  - 2 x daily - 7 x weekly - 2 sets - 10 reps - Standing Hip Extension  - 2 x daily - 7 x weekly - 2 sets - 10 reps - Mini Squat  - 2 x daily - 7 x weekly - 2 sets - 10 reps 06/20/22 - Lunge with Counter Support  - 1 x daily - 7 x weekly - 2 sets - 10 reps - Side Stepping with Resistance at Thighs  - 1 x daily - 7 x weekly - 2 sets - 10 reps - Standing Shoulder Row with Anchored Resistance  - 1 x daily - 7 x weekly - 2 sets - 20 reps - Shoulder extension with resistance - Neutral  - 1 x daily - 7 x weekly - 2 sets - 10 reps  ASSESSMENT:  CLINICAL IMPRESSION: Patient with continued slight weight shift off LLE but improving compared to evaluation. Continued with LE strengthening which is completed with good mechanics but fatigue noted. Intermittent rest breaks required for fatigue. Began resisted postural strengthening for trunk, core, and proximal UE strength.  Anticipate d/c next session. Patient will continue to benefit from skilled physical therapy in order to improve function and reduce impairment.   OBJECTIVE IMPAIRMENTS: Abnormal gait, decreased  activity tolerance, decreased balance, decreased endurance, decreased mobility, difficulty walking, decreased strength, impaired flexibility, and improper body mechanics.   ACTIVITY LIMITATIONS: carrying, lifting, bending, standing, squatting, stairs, transfers, locomotion level, and caring for others  PARTICIPATION LIMITATIONS: meal prep, cleaning, laundry, shopping, community activity, and yard work  PERSONAL FACTORS: Time since onset of injury/illness/exacerbation and 1-2 comorbidities: polymyositis, HTN  are also affecting patient's functional outcome.   REHAB POTENTIAL: Good  CLINICAL DECISION MAKING: Stable/uncomplicated  EVALUATION COMPLEXITY: Low   GOALS: Goals reviewed with patient? Yes  SHORT TERM GOALS: Target date: 06/18/2022  Patient will be independent with HEP in order to improve functional outcomes. Baseline:  Goal status: IN PROGRESS  2.  Patient will report at least 25% improvement in symptoms for improved quality of life. Baseline:  Goal status: IN PROGRESS    LONG TERM GOALS: Target date: 07/02/2022  Patient will report at least 75% improvement in symptoms for improved quality of life. Baseline:  Goal status: IN PROGRESS  2.  Patient will demonstrate grade of 5/5 MMT grade in all tested musculature as evidence of improved strength to assist with stair ambulation and gait.  Baseline: see above Goal status: IN PROGRESS  3.  Patient will be able transition to self management with HEP and exercise routine to live a healthy lifestyle.  Baseline:  Goal status: IN PROGRESS       PLAN:  PT FREQUENCY: 1x/week  PT DURATION: 4 weeks  PLANNED INTERVENTIONS: Therapeutic exercises, Therapeutic activity, Neuromuscular re-education, Balance training, Gait training, Patient/Family education, Joint manipulation, Joint mobilization, Stair training, Orthotic/Fit training,  DME instructions, Aquatic Therapy, Dry Needling, Electrical stimulation, Spinal  manipulation, Spinal mobilization, Cryotherapy, Moist heat, Compression bandaging, scar mobilization, Splintting, Taping, Traction, Ultrasound, Ionotophoresis 63m/ml Dexamethasone, and Manual therapy   PLAN FOR NEXT SESSION: Advance HEP as able. Functional strength, endurance.  Update HEP as progresses.    AVianne BullsZaunegger, PT 06/20/2022, 2:25 PM

## 2022-06-26 ENCOUNTER — Encounter (HOSPITAL_COMMUNITY): Payer: Self-pay | Admitting: Physical Therapy

## 2022-06-26 ENCOUNTER — Ambulatory Visit (HOSPITAL_COMMUNITY): Payer: BC Managed Care – PPO | Attending: Internal Medicine | Admitting: Physical Therapy

## 2022-06-26 DIAGNOSIS — R2689 Other abnormalities of gait and mobility: Secondary | ICD-10-CM | POA: Diagnosis present

## 2022-06-26 DIAGNOSIS — M6281 Muscle weakness (generalized): Secondary | ICD-10-CM | POA: Diagnosis present

## 2022-06-26 DIAGNOSIS — R29898 Other symptoms and signs involving the musculoskeletal system: Secondary | ICD-10-CM | POA: Insufficient documentation

## 2022-06-26 NOTE — Therapy (Signed)
OUTPATIENT PHYSICAL THERAPY Treatment  Patient Name: Kristen Cooper MRN: [id number] DOB:Jul 28, 1965, 56 y.o., female Today's Date: 06/26/2022 PHYSICAL THERAPY DISCHARGE SUMMARY  Visits from Start of Care: 4  Current functional level related to goals / functional outcomes: See below   Remaining deficits: See below   Education / Equipment: See below   Patient agrees to discharge. Patient goals were met. Patient is being discharged due to meeting the stated rehab goals.    PT End of Session - 06/26/22 1118     Visit Number 4    Number of Visits 4    Date for PT Re-Evaluation 07/02/22    Authorization Type BCBS    PT Start Time 1118    PT Stop Time 1156    PT Time Calculation (min) 38 min    Activity Tolerance Patient tolerated treatment well    Behavior During Therapy WFL for tasks assessed/performed             Past Medical History:  Diagnosis Date   Arthritis    Crohn disease ([place]) GI-- dr setzer/ dr Laural Golden:   rheumologist-  dr Amil Amen   dx 1987   Dermatography 08/2012   To many histamine cells   GERD (gastroesophageal reflux disease)    History of kidney stones    Hypertension    Inflammatory bowel disease    Polymyositis ([place])    Past Surgical History:  Procedure Laterality Date   APPENDECTOMY  1987   BIOPSY  08/09/2021   Procedure: BIOPSY;  Surgeon: Harvel Quale, MD;  [facility];  Service: Gastroenterology;;   COLONOSCOPY N/A 10/27/2015   Procedure: COLONOSCOPY;  Surgeon: Rogene Houston, MD;  [facility];  Service: Endoscopy;  Laterality: N/A;  11:00   COLONOSCOPY WITH PROPOFOL N/A 08/09/2021   Procedure: COLONOSCOPY WITH PROPOFOL;  Surgeon: Harvel Quale, MD;  [facility];  Service: Gastroenterology;  Laterality: N/A;  8:15   CYSTOSCOPY/URETEROSCOPY/HOLMIUM LASER/STENT PLACEMENT Left 01/13/2021   Procedure: CYSTOSCOPY/URETEROSCOPY/REMOVAL OF LEFT RENAL AND LEFT DISTAL STONES/STENT PLACEMENT;   Surgeon: Irine Seal, MD;  [facility];  Service: Urology;  Laterality: Left;   ESOPHAGEAL DILATION N/A 03/03/2020   Procedure: ESOPHAGEAL DILATION;  Surgeon: Rogene Houston, MD;  [facility];  Service: Endoscopy;  Laterality: N/A;   ESOPHAGOGASTRODUODENOSCOPY N/A 03/03/2020   Procedure: ESOPHAGOGASTRODUODENOSCOPY (EGD);  Surgeon: Rogene Houston, MD;  [facility];  Service: Endoscopy;  Laterality: N/A;  1155   MUSCLE BIOPSY Right 01/02/2017   Procedure: UPPER RIGHT  EXTREMITY MUSCLE BIOPSY;  Surgeon: Ralene Ok, MD;  Location: [facility];  Service: General;  Laterality: Right;   POLYPECTOMY  08/09/2021   Procedure: POLYPECTOMY;  Surgeon: Harvel Quale, MD;  [facility];  Service: Gastroenterology;;   UPPER GASTROINTESTINAL ENDOSCOPY  ?   WISDOM TOOTH EXTRACTION     Patient Active Problem List   Diagnosis Date Noted   GERD (gastroesophageal reflux disease) 06/22/2021   Schatzki's ring 06/22/2021   Polymyositis ([place]) 03/03/2020   Crohn disease ([place]) 09/21/2013    PCP: Kassie Mends PA-C  REFERRING PROVIDER: Hennie Duos, MD  REFERRING DIAG: polymyositis , leg weakness  THERAPY DIAG:  Muscle weakness (generalized)  Other abnormalities of gait and mobility  Other symptoms and signs involving the musculoskeletal system  Rationale for Evaluation and Treatment: Rehabilitation  ONSET DATE: 2018  SUBJECTIVE:   SUBJECTIVE STATEMENT:  Patient states everything is going well. Thighs were a  little sore after last time. HEP is going well, no problems. Walking is about the same, slowed it up a little bit so she doesn't get winded. Patient states about 100% improvement since beginning PT.   PERTINENT HISTORY: HTN, polymyositis PAIN:  Are you having pain? No  PRECAUTIONS: None  WEIGHT BEARING RESTRICTIONS: No  FALLS:  Has patient fallen in last 6 months? Yes. Number of falls 1-2  LIVING  ENVIRONMENT: Lives with: lives with their spouse Lives in: House/apartment Stairs: Yes: Internal: 13 to basement steps; on right going up and External: 5 steps; on left going up Has following equipment at home: None  OCCUPATION: Retired  PLOF: Independent  PATIENT GOALS: improve strength, learn how to improve strength  NEXT MD VISIT: March 2024  OBJECTIVE:  (Objective measures from initial evaluation unless otherwise dated)  COGNITION: Overall cognitive status: Within functional limits for tasks assessed     SENSATION: WFL   POSTURE: rounded shoulders and forward head  PALPATION: No tenderness in legs  LOWER EXTREMITY ROM: WFL for tasks assessed  Active ROM Right eval Left eval  Hip flexion    Hip extension    Hip abduction    Hip adduction    Hip internal rotation    Hip external rotation    Knee flexion    Knee extension    Ankle dorsiflexion    Ankle plantarflexion    Ankle inversion    Ankle eversion     (Blank rows = not tested)  LOWER EXTREMITY MMT:  MMT Right eval Left eval Right 06/26/22 Left 06/26/22  Hip flexion 4+ 4+ 5 5  Hip extension _0 Hip abduction _1 Hip adduction      Hip internal rotation      Hip external rotation      Knee flexion 4+ 4+ 5 5  Knee extension 4+ 4+ 5 5  Ankle dorsiflexion _2 Ankle plantarflexion      Ankle inversion      Ankle eversion       (Blank rows = not tested)    FUNCTIONAL TESTS:  5 times sit to stand: 8.05 seconds without UE use 2 minute walk test: 450 feet WFL Squat : fair mechanics with shift off L knee with increasing depth Stairs: 7 inch, alternating pattern, WFL, tired/heavy feeling with stairs SLS: >30 seconds bilateral, minimal sway  GAIT: Distance walked: 450 feet Assistive device utilized: None Level of assistance: Complete Independence Comments: 2MWT   TODAY'S TREATMENT:                                                                                                                               DATE:  06/26/22 Reassessment Review of patients core handouts Education on progression of exercises and endurance  06/20/22 Squat 1 x 10  Lunge 1 x 10 bilateral with unilateral UE support Lateral stepping 2 x 12 feet  bilateral GTB at knees Standing row GTB 2x 20  Standing shoulder extension GTB 2x 10   06/11/22 Standing:   squats 10X  Hip abduction 10X2  Hip extension 10X2  Lunges onto 4" step 10X2  Vectors 5X5" with 1 UE assist Supine:  bridge 2X10  Abdominal stab/isometrics with exercises  SLR 10X Side lying hip abduction 10X each Prone hip extension 10X each  06/04/22 Sidelying hip abduction 1 x 10 bilateral Prone hip extension 1 x 10 bilateral Bridge 1 x 10 bilateral  Squat 1 x 10     PATIENT EDUCATION:  Education details: Patient educated on exam findings, POC, scope of PT, HEP, and exercise mechanics. Person educated: Patient Education method: Explanation, Demonstration, and Handouts Education comprehension: verbalized understanding, returned demonstration, verbal cues required, and tactile cues required  HOME EXERCISE PROGRAM: Access Code: [email or web address] URL: [email or web address] Date: 06/11/2022 Prepared by: Roseanne Reno  Exercises - Supine Bridge  - 2 x daily - 7 x weekly - 2 sets - 10 reps - Supine Active Straight Leg Raise  - 2 x daily - 7 x weekly - 2 sets - 10 reps - Sidelying Hip Abduction  - 2 x daily - 7 x weekly - 2 sets - 10 reps - Beginner Prone Single Leg Raise  - 2 x daily - 7 x weekly - 2 sets - 10 reps - Standing Hip Abduction  - 2 x daily - 7 x weekly - 2 sets - 10 reps - Standing Hip Extension  - 2 x daily - 7 x weekly - 2 sets - 10 reps - Mini Squat  - 2 x daily - 7 x weekly - 2 sets - 10 reps 06/20/22 - Lunge with Counter Support  - 1 x daily - 7 x weekly - 2 sets - 10 reps - Side Stepping with Resistance at Thighs  - 1 x daily - 7 x weekly - 2 sets - 10 reps - Standing Shoulder Row with Anchored  Resistance  - 1 x daily - 7 x weekly - 2 sets - 20 reps - Shoulder extension with resistance - Neutral  - 1 x daily - 7 x weekly - 2 sets - 10 reps  ASSESSMENT:  CLINICAL IMPRESSION: Patient has met 2/2 short term goals and 3/3 long term goals with ability to complete HEP and improvement in symptoms, strength, activity tolerance, gait, endurance, and functional mobility. Completed reassessment and reviewed progress with patient. Reviewed exercises with patient that she was previously completing and educated on ways to modify if needed. Discussed HEP and endurance exercises and how to progress. Instructed patient on returning to PT if needed. Patient discharged from PT at this time.     OBJECTIVE IMPAIRMENTS: Abnormal gait, decreased activity tolerance, decreased balance, decreased endurance, decreased mobility, difficulty walking, decreased strength, impaired flexibility, and improper body mechanics.   ACTIVITY LIMITATIONS: carrying, lifting, bending, standing, squatting, stairs, transfers, locomotion level, and caring for others  PARTICIPATION LIMITATIONS: meal prep, cleaning, laundry, shopping, community activity, and yard work  PERSONAL FACTORS: Time since onset of injury/illness/exacerbation and 1-2 comorbidities: polymyositis, HTN  are also affecting patient's functional outcome.   REHAB POTENTIAL: Good  CLINICAL DECISION MAKING: Stable/uncomplicated  EVALUATION COMPLEXITY: Low   GOALS: Goals reviewed with patient? Yes  SHORT TERM GOALS: Target date: 06/18/2022  Patient will be independent with HEP in order to improve functional outcomes. Baseline:  Goal status: MET  2.  Patient will report at least 25% improvement in symptoms for improved quality of  life. Baseline:  Goal status: MET    LONG TERM GOALS: Target date: 07/02/2022  Patient will report at least 75% improvement in symptoms for improved quality of life. Baseline:  Goal status: MET  2.  Patient will  demonstrate grade of 5/5 MMT grade in all tested musculature as evidence of improved strength to assist with stair ambulation and gait.  Baseline: see above Goal status: MET  3.  Patient will be able transition to self management with HEP and exercise routine to live a healthy lifestyle.  Baseline:  Goal status: MET       PLAN:  PT FREQUENCY: 1x/week  PT DURATION: 4 weeks  PLANNED INTERVENTIONS: Therapeutic exercises, Therapeutic activity, Neuromuscular re-education, Balance training, Gait training, Patient/Family education, Joint manipulation, Joint mobilization, Stair training, Orthotic/Fit training, DME instructions, Aquatic Therapy, Dry Needling, Electrical stimulation, Spinal manipulation, Spinal mobilization, Cryotherapy, Moist heat, Compression bandaging, scar mobilization, Splintting, Taping, Traction, Ultrasound, Ionotophoresis 59m/ml Dexamethasone, and Manual therapy   PLAN FOR NEXT SESSION: n/a    AVianne BullsZaunegger, PT 06/26/2022, 11:18 AM

## 2022-07-03 ENCOUNTER — Encounter (HOSPITAL_COMMUNITY): Payer: BC Managed Care – PPO | Admitting: Physical Therapy

## 2022-10-24 ENCOUNTER — Encounter (INDEPENDENT_AMBULATORY_CARE_PROVIDER_SITE_OTHER): Payer: Self-pay | Admitting: Gastroenterology

## 2022-12-27 ENCOUNTER — Ambulatory Visit (INDEPENDENT_AMBULATORY_CARE_PROVIDER_SITE_OTHER): Payer: BC Managed Care – PPO | Admitting: Gastroenterology

## 2023-02-14 ENCOUNTER — Encounter (INDEPENDENT_AMBULATORY_CARE_PROVIDER_SITE_OTHER): Payer: Self-pay | Admitting: Gastroenterology

## 2023-02-14 ENCOUNTER — Ambulatory Visit (INDEPENDENT_AMBULATORY_CARE_PROVIDER_SITE_OTHER): Payer: BC Managed Care – PPO | Admitting: Gastroenterology

## 2023-02-14 VITALS — BP 96/64 | HR 82 | Temp 98.2°F | Ht 63.0 in | Wt 107.3 lb

## 2023-02-14 DIAGNOSIS — K5080 Crohn's disease of both small and large intestine without complications: Secondary | ICD-10-CM

## 2023-02-14 NOTE — Progress Notes (Addendum)
Referring Provider: Joeseph Amor Primary Care Physician:  Joeseph Amor Primary GI Physician: Levon Hedger   Chief Complaint  Patient presents with   Crohn's Disease    Follow up on Crohn's gets avsola infusion every 8 weeks at [facility] rheumatology. States doing well and no concerns.     HPI:   Kristen Cooper is a 57 y.o. female with past medical history of polymyositis, polyarthritis, GERD complicated by Schatzki's ring and ileocolonic Crohn's disease on infliximab and methotrexate   Patient presenting today for follow up of Crohn's disease  Last seen June 2023, at that time feeling well on Avsola every 8 weeks and methotrexate10mg  every week. Having some constipation.   Recommended updating CBC, CMP, CRP vit d, fecal calprotectin, continue infliximab 5mg  kg every 8 weeks, continue methotrexate SQ weekly per rheum, consider repat Colonoscopy January 2025  Present:  She states she is doing well. Having 1-2 BMs per day. Stools are formed. No abdominal pain. No rectal bleeding or melena. No changes in appetite or weight loss. No nausea or vomiting. She is feeling well overall. Has no GI   Denies joint pain or joint swelling No vision changes Denies rashes or skin lesions  She gets labs done every 8 weeks at infusion clinic, she has next infusion in 2 weeks. Thinks they checked inflammatory markers at last infusion as she had missed 1 infusion and was late on the next due to insurance issues.  Will obtain labs from infusion center- [place] [facility]   Last flu shot: not interested Last pneumonia shot: never, not interested Last Pap smear: due next year Last evaluation by dermatology: saw them last in June 2023 Last zoster vaccine: never, not interested COVID-19 shot: never, not interested   Last EGD: Last Colonoscopy: 07/2021 The examined portion of the ileum was normal. Biopsied. - Simple Endoscopic Score for Crohn's Disease: 0, mucosal inflammatory changes secondary to  Crohn's disease, in remission. Biopsied. - One 4 mm polyp in the cecum, removed with a cold snare. Resected and retrieved. - A single non-bleeding colonic angiodysplastic lesion. - The distal rectum and anal verge are normal on retroflexion view.   Path: A. COLON, CECAL, POLYPECTOMY:  - Colonic mucosa with prominent submucosal lymphoid aggregate.  No  inflammation, dysplasia or malignancy.  No granulomas are identified.   B. SMALL BOWEL, BIOPSY:  - Small bowel mucosa, no significant abnormality.  No granulomas are  identified.   C. COLON, CECAL, BIOPSY:  - Colonic mucosa, no significant abnormality.  No inflammation,  dysplasia, or metaplasia.  Granulomas are not identified.    Recommended repeat in 2 years  Past Medical History:  Diagnosis Date   Arthritis    Crohn disease [facility]) GI-- dr setzer/ dr Karilyn Cota:   rheumologist-  dr Dierdre Forth   dx 1987   Dermatography 08/2012   To many histamine cells   GERD (gastroesophageal reflux disease)    History of kidney stones    Hypertension    Inflammatory bowel disease    Polymyositis [facility])     Past Surgical History:  Procedure Laterality Date   APPENDECTOMY  1987   BIOPSY  08/09/2021   Procedure: BIOPSY;  Surgeon: Dolores Frame, MD;  [facility];  Service: Gastroenterology;;   COLONOSCOPY N/A 10/27/2015   Procedure: COLONOSCOPY;  Surgeon: Malissa Hippo, MD;  [facility];  Service: Endoscopy;  Laterality: N/A;  11:00   COLONOSCOPY WITH PROPOFOL N/A 08/09/2021   Procedure: COLONOSCOPY WITH  PROPOFOL;  Surgeon: Marguerita Merles, Reuel Boom, MD;  [facility];  Service: Gastroenterology;  Laterality: N/A;  8:15   CYSTOSCOPY/URETEROSCOPY/HOLMIUM LASER/STENT PLACEMENT Left 01/13/2021   Procedure: CYSTOSCOPY/URETEROSCOPY/REMOVAL OF LEFT RENAL AND LEFT DISTAL STONES/STENT PLACEMENT;  Surgeon: Bjorn Pippin, MD;  [facility]: [facility];  Service: Urology;  Laterality: Left;   ESOPHAGEAL DILATION N/A 03/03/2020    Procedure: ESOPHAGEAL DILATION;  Surgeon: Malissa Hippo, MD;  [facility];  Service: Endoscopy;  Laterality: N/A;   ESOPHAGOGASTRODUODENOSCOPY N/A 03/03/2020   Procedure: ESOPHAGOGASTRODUODENOSCOPY (EGD);  Surgeon: Malissa Hippo, MD;  [facility];  Service: Endoscopy;  Laterality: N/A;  1155   MUSCLE BIOPSY Right 01/02/2017   Procedure: UPPER RIGHT  EXTREMITY MUSCLE BIOPSY;  Surgeon: Axel Filler, MD;  Location: [facility];  Service: General;  Laterality: Right;   POLYPECTOMY  08/09/2021   Procedure: POLYPECTOMY;  Surgeon: Dolores Frame, MD;  [facility];  Service: Gastroenterology;;   UPPER GASTROINTESTINAL ENDOSCOPY  ?   WISDOM TOOTH EXTRACTION      Current Outpatient Medications  Medication Sig Dispense Refill   acetaminophen (TYLENOL) 500 MG tablet Take 500 mg by mouth every 8 (eight) hours as needed for headache.     B-D TB SYRINGE 1CC/27GX1/2" 27G X 1/2" 1 ML MISC Inject into the skin once a week.     cetirizine (ZYRTEC) 10 MG tablet Take 10 mg by mouth at bedtime.      famotidine-calcium carbonate-magnesium hydroxide (PEPCID COMPLETE) 10-800-165 MG chewable tablet Chew 1 tablet by mouth daily as needed (acid reflux).     fexofenadine (ALLEGRA) 180 MG tablet Take 180 mg by mouth daily.      folic acid (FOLVITE) 800 MCG tablet Take 1,600 mcg by mouth daily.      inFLIXimab-axxq (AVSOLA IV) Inject 5 mg into the vein every 8 (eight) weeks. 5 mg per Kg Every 8 weeks.(Gets at [facility])     lisinopril-hydrochlorothiazide (PRINZIDE,ZESTORETIC) 10-12.5 MG per tablet Take 0.5 tablets by mouth daily.      methotrexate 50 MG/2ML injection SMARTSIG:0.4 Milliliter(s) SUB-Q Once a Week     Olopatadine HCl (PATADAY) 0.7 % SOLN Place 1 drop into both eyes daily.     Propylene Glycol (SYSTANE BALANCE) 0.6 % SOLN Place 1 application into both eyes daily as needed (Dry eyes).     No current facility-administered  medications for this visit.    Allergies as of 02/14/2023 - Review Complete 02/14/2023  Allergen Reaction Noted   Ra moisturizing oatmeal [albolene]  06/26/2021   Oatmeal Hives and Itching 05/01/2016    Family History  Problem Relation Age of Onset   Crohn's disease Mother    Irritable bowel syndrome Father    Hypertension Brother    Healthy Brother    Healthy Brother     Social History   Socioeconomic History   Marital status: Married    Spouse name: Not on file   Number of children: Not on file   Years of education: Not on file   Highest education level: Not on file  Occupational History   Not on file  Tobacco Use   Smoking status: Never    Passive exposure: Past   Smokeless tobacco: Never   Tobacco comments:    Exposed as a child   Vaping Use   Vaping status: Never Used  Substance and Sexual Activity   Alcohol use: No    Alcohol/week: 0.0 standard drinks of alcohol   Drug  use: No   Sexual activity: Not on file  Other Topics Concern   Not on file  Social History Narrative   Not on file   Social Determinants of Health   Financial Resource Strain: Not on file  Food Insecurity: Not on file  Transportation Needs: Not on file  Physical Activity: Not on file  Stress: Not on file  Social Connections: Not on file   Review of systems General: negative for malaise, night sweats, fever, chills, weight loss Neck: Negative for lumps, goiter, pain and significant neck swelling Resp: Negative for cough, wheezing, dyspnea at rest CV: Negative for chest pain, leg swelling, palpitations, orthopnea GI: denies melena, hematochezia, nausea, vomiting, diarrhea, constipation, dysphagia, odyonophagia, early satiety or unintentional weight loss.  MSK: Negative for joint pain or swelling, back pain, and muscle pain. Derm: Negative for itching or rash Psych: Denies depression, anxiety, memory loss, confusion. No homicidal or suicidal ideation.  Heme: Negative for prolonged  bleeding, bruising easily, and swollen nodes. Endocrine: Negative for cold or heat intolerance, polyuria, polydipsia and goiter. Neuro: negative for tremor, gait imbalance, syncope and seizures. The remainder of the review of systems is noncontributory.  Physical Exam: BP 96/64 (BP Location: Left Arm, Patient Position: Sitting, Cuff Size: Normal)   Pulse 82   Temp 98.2 F (36.8 C) (Oral)   Ht 5\' 3"  (1.6 m)   Wt 107 lb 4.8 oz (48.7 kg)   LMP 06/03/2016 (Approximate)   BMI 19.01 kg/m  General:   Alert and oriented. No distress noted. Pleasant and cooperative.  Head:  Normocephalic and atraumatic. Eyes:  Conjuctiva clear without scleral icterus. Mouth:  Oral mucosa pink and moist. Good dentition. No lesions. Heart: Normal rate and rhythm, s1 and s2 heart sounds present.  Lungs: Clear lung sounds in all lobes. Respirations equal and unlabored. Abdomen:  +BS, soft, and non-distended. Mild TTP of RLQ which she reports is chronic. No rebound or guarding. No HSM or masses noted. Derm: No palmar erythema or jaundice Msk:  Symmetrical without gross deformities. Normal posture. Extremities:  Without edema. Neurologic:  Alert and  oriented x4 Psych:  Alert and cooperative. Normal mood and affect.  Invalid input(s): "6 MONTHS"   ASSESSMENT: EAVAN GONTERMAN is a 57 y.o. female presenting today for follow up of Crohn's disease  Patient is maintained on infliximab 5mg /kg every 8 weeks, also on methotrexate managed by rheumatology. Having 1-2 formed BMs per day without rectal bleeding or abdominal pain. She has presented with both clinical and endoscopic remission on current regimen. Ideally should be on monotherapy, however, she is maintained on methotrexate for rheumatologic reasons and will defer to rheumatology for continued management of this. She has no GI complaints today. States she had recent labs with her last infusion, will obtain labs to determine if any further lab evaluations are needed  (needs CBC, CMP, Fecal calprotectin, CRP, Hep B and TB quant). She will be due for repeat Colonoscopy again in January 2025.   Patient is not interested in updating vaccines, as above.    PLAN:  Will obtain recent labs from rheumatology  2. Continue with infliximab 5mg /kg Q8w 3. Continue methotrexate at recommendations of rheumatology 4. Repeat Colonoscopy January 2025  All questions were answered, patient verbalized understanding and is in agreement with plan as outlined above.   Follow Up: 1 year   Jadasia Haws L. Jeanmarie Hubert, MSN, APRN, AGNP-C Adult-Gerontology Nurse Practitioner [facility]  I have reviewed the note and agree with the APP's  assessment as described in this progress note  Longstanding use of combination treatment increases risk of lymphoma. Will defer continuation of methotrexate to rheumatologist,  Katrinka Blazing, MD Gastroenterology and Hepatology [facility]

## 2023-02-14 NOTE — Patient Instructions (Signed)
We will continue with infliximab every 8 weeks as you are doing and methotrexate per recommendations from your rheumatologist I will obtain labs from infusion center to ensure you are up to date on everything and will contact you if we need to add any further labs We will plan for repeat colonoscopy in January 2025  Follow up 1 year, sooner if you have any new or worsening GI issues  It was a pleasure to see you today. I want to create trusting relationships with patients and provide genuine, compassionate, and quality care. I truly value your feedback! please be on the lookout for a survey regarding your visit with me today. I appreciate your input about our visit and your time in completing this!    Venetta Knee L. Jeanmarie Hubert, MSN, APRN, AGNP-C Adult-Gerontology Nurse Practitioner [facility] Gastroenterology at [facility]

## 2023-02-14 NOTE — Progress Notes (Deleted)
She states she is doing well. Having 1-2 BMs per day. Stools are formed. No abdominal pain. No rectal bleeding or melena. No changes in appetite or weight loss. No nausea or vomiting  Denies joint pain or joint swelling No vision changes Denies rashes or skin lesions  Flu shot: declined Pna shot : declined Pap smear: due next year  Dermatology: last seen in June 2023, no issues at that time  Last zoster: never, declined Covid vaccine: never, declined  She gets labs done every 8 weeks at infusion clinic, she has next infusion in 2 weeks. Thinks they checked inflammatory markers at last infusion as she had missed 1 infusion and was late on the next due to insurance issues.  Will obtain labs from infusion center- [place] [facility]

## 2023-04-25 ENCOUNTER — Ambulatory Visit: Payer: BC Managed Care – PPO | Admitting: Urology

## 2023-07-10 ENCOUNTER — Encounter (INDEPENDENT_AMBULATORY_CARE_PROVIDER_SITE_OTHER): Payer: Self-pay | Admitting: *Deleted

## 2023-07-26 ENCOUNTER — Telehealth (INDEPENDENT_AMBULATORY_CARE_PROVIDER_SITE_OTHER): Payer: Self-pay | Admitting: Gastroenterology

## 2023-07-26 NOTE — Telephone Encounter (Signed)
 Received the results of the most recent blood workup performed on 07/25/2023 which showed a total bilirubin of 2.2, at [facility] was normal 75, ALT 26, AST 24, normal electrolytes and renal function.SABRA  Hi Crystal,  Can you please call the patient and tell the patient the CMP showed a slightly more elevated total bilirubin?  Will need to check repeat labs in a week  -please send her an order for repeat CMP and direct bilirubin.  Thanks,  Toribio Fortune, MD Gastroenterology and Hepatology [facility]

## 2023-07-29 ENCOUNTER — Other Ambulatory Visit (INDEPENDENT_AMBULATORY_CARE_PROVIDER_SITE_OTHER): Payer: Self-pay

## 2023-07-29 ENCOUNTER — Encounter (INDEPENDENT_AMBULATORY_CARE_PROVIDER_SITE_OTHER): Payer: Self-pay

## 2023-07-29 DIAGNOSIS — K5080 Crohn's disease of both small and large intestine without complications: Secondary | ICD-10-CM

## 2023-07-29 DIAGNOSIS — R17 Unspecified jaundice: Secondary | ICD-10-CM

## 2023-07-29 DIAGNOSIS — K222 Esophageal obstruction: Secondary | ICD-10-CM

## 2023-07-29 DIAGNOSIS — R7989 Other specified abnormal findings of blood chemistry: Secondary | ICD-10-CM

## 2023-07-29 DIAGNOSIS — K2100 Gastro-esophageal reflux disease with esophagitis, without bleeding: Secondary | ICD-10-CM

## 2023-07-29 DIAGNOSIS — M3320 Polymyositis, organ involvement unspecified: Secondary | ICD-10-CM

## 2023-07-29 NOTE — Telephone Encounter (Signed)
 Me to Kristen Cooper [facility]      07/29/23 11:44 AM Per Dr. Eartha,   Received the results of the most recent blood workup performed on 07/25/2023 which showed a total bilirubin of 2.2, at [facility] was normal 75, ALT 26, AST 24, normal electrolytes and renal function..   Per Dr. Eartha,    tell the patient the CMP showed a slightly more elevated total bilirubin?  Will need to check repeat labs Cmp,and Direct Bilirubin in a week, around 08/02/2023. I have placed these labs in Epic for you, to be done at [facility] around 08/02/2023. The lab is located at [address] [place], [address]. Let us  know if you have any questions.   Thanks, Venancio Chenier.   Last read by Kristen Cooper at 11:45 AM on 07/29/2023.

## 2023-08-01 ENCOUNTER — Telehealth (INDEPENDENT_AMBULATORY_CARE_PROVIDER_SITE_OTHER): Payer: Self-pay | Admitting: Gastroenterology

## 2023-08-01 DIAGNOSIS — K5080 Crohn's disease of both small and large intestine without complications: Secondary | ICD-10-CM

## 2023-08-01 NOTE — Telephone Encounter (Signed)
 Room 1 Thanks

## 2023-08-01 NOTE — Telephone Encounter (Signed)
 Who is your primary care physician: Mercia Dowe  Reasons for the colonoscopy: 1 year recall  Have you had a colonoscopy before? yes  Do you have family history of colon cancer? no  Previous colonoscopy with polyps removed? yes  Do you have a history colorectal cancer?   no  Are you diabetic? If yes, Type 1 or Type 2?    no  Do you have a prosthetic or mechanical heart valve? no  Do you have a pacemaker/defibrillator?   no  Have you had endocarditis/atrial fibrillation? no  Have you had joint replacement within the last 12 months?  no  Do you tend to be constipated or have to use laxatives? no  Do you have any history of drugs or alchohol?  no  Do you use supplemental oxygen?  no  Have you had a stroke or heart attack within the last 6 months? no  Do you take weight loss medication?  no  For female patients: have you had a hysterectomy?  no                                     are you post menopausal?       yes                                            do you still have your menstrual cycle? no      Do you take any blood-thinning medications such as: (aspirin, warfarin, Plavix, Aggrenox)  no  If yes we need the name, milligram, dosage and who is prescribing doctor  Current Outpatient Medications on File Prior to Visit  Medication Sig Dispense Refill   acetaminophen  (TYLENOL ) 500 MG tablet Take 500 mg by mouth every 8 (eight) hours as needed for headache.     B-D TB SYRINGE 1CC/27GX1/2 27G X 1/2 1 ML MISC Inject into the skin once a week.     cetirizine (ZYRTEC) 10 MG tablet Take 10 mg by mouth at bedtime.      famotidine-calcium carbonate-magnesium hydroxide (PEPCID COMPLETE) 10-800-165 MG chewable tablet Chew 1 tablet by mouth daily as needed (acid reflux).     fexofenadine (ALLEGRA) 180 MG tablet Take 180 mg by mouth daily.      folic acid (FOLVITE) 800 MCG tablet Take 1,600 mcg by mouth daily.      inFLIXimab -axxq (AVSOLA  IV) Inject 5 mg into the vein every 8 (eight)  weeks. 5 mg per Kg Every 8 weeks.(Gets at [facility])     lisinopril-hydrochlorothiazide (PRINZIDE,ZESTORETIC) 10-12.5 MG per tablet Take 0.5 tablets by mouth daily.      methotrexate 50 MG/2ML injection SMARTSIG:0.4 Milliliter(s) SUB-Q Once a Week     Olopatadine HCl (PATADAY) 0.7 % SOLN Place 1 drop into both eyes daily.     Propylene Glycol (SYSTANE BALANCE) 0.6 % SOLN Place 1 application into both eyes daily as needed (Dry eyes).     [DISCONTINUED] pantoprazole  (PROTONIX ) 40 MG tablet Take 1 tablet (40 mg total) by mouth daily before breakfast. 30 tablet 3   No current facility-administered medications on file prior to visit.    Allergies  Allergen Reactions   Ra Moisturizing Oatmeal [Albolene]     Other reaction(s): hives/oatmeal   Oatmeal Hives and Itching     Pharmacy: [facility]  Primary Insurance Name: [employer] [facility] [employer]  Best number where you can be reached: [phone]

## 2023-08-02 ENCOUNTER — Other Ambulatory Visit (INDEPENDENT_AMBULATORY_CARE_PROVIDER_SITE_OTHER): Payer: Self-pay | Admitting: *Deleted

## 2023-08-02 DIAGNOSIS — R7989 Other specified abnormal findings of blood chemistry: Secondary | ICD-10-CM

## 2023-08-02 DIAGNOSIS — K222 Esophageal obstruction: Secondary | ICD-10-CM

## 2023-08-02 DIAGNOSIS — R17 Unspecified jaundice: Secondary | ICD-10-CM

## 2023-08-02 DIAGNOSIS — M3320 Polymyositis, organ involvement unspecified: Secondary | ICD-10-CM

## 2023-08-02 DIAGNOSIS — K5080 Crohn's disease of both small and large intestine without complications: Secondary | ICD-10-CM

## 2023-08-02 DIAGNOSIS — K2100 Gastro-esophageal reflux disease with esophagitis, without bleeding: Secondary | ICD-10-CM

## 2023-08-03 LAB — COMPREHENSIVE METABOLIC PANEL
ALT: 36 [IU]/L — ABNORMAL HIGH (ref 0–32)
AST: 35 [IU]/L (ref 0–40)
Albumin: 4.9 g/dL (ref 3.8–4.9)
Alkaline Phosphatase: 102 [IU]/L (ref 44–121)
BUN/Creatinine Ratio: 24 — ABNORMAL HIGH (ref 9–23)
BUN: 20 mg/dL (ref 6–24)
Bilirubin Total: 1.3 mg/dL — ABNORMAL HIGH (ref 0.0–1.2)
CO2: 22 mmol/L (ref 20–29)
Calcium: 9.8 mg/dL (ref 8.7–10.2)
Chloride: 99 mmol/L (ref 96–106)
Creatinine, Ser: 0.83 mg/dL (ref 0.57–1.00)
Globulin, Total: 2.4 g/dL (ref 1.5–4.5)
Glucose: 105 mg/dL — ABNORMAL HIGH (ref 70–99)
Potassium: 4.3 mmol/L (ref 3.5–5.2)
Sodium: 139 mmol/L (ref 134–144)
Total Protein: 7.3 g/dL (ref 6.0–8.5)
eGFR: 82 mL/min/{1.73_m2} (ref >59)

## 2023-08-03 LAB — BILIRUBIN, DIRECT: Bilirubin, Direct: 0.37 mg/dL (ref 0.00–0.40)

## 2023-08-05 ENCOUNTER — Encounter (INDEPENDENT_AMBULATORY_CARE_PROVIDER_SITE_OTHER): Payer: Self-pay

## 2023-08-06 NOTE — Telephone Encounter (Signed)
 Left message to return call

## 2023-08-12 MED ORDER — PEG 3350-KCL-NA BICARB-NACL 420 G PO SOLR: 4000.0000 mL | Freq: Once | ORAL | 0 refills | Status: AC

## 2023-08-12 NOTE — Addendum Note (Signed)
Addended by: Marlowe Shores on: 08/12/2023 02:04 PM   Modules accepted: Orders

## 2023-08-12 NOTE — Telephone Encounter (Signed)
Pt contacted and scheduled. Prep sent to pharmacy. No pa needed per insurance. Will put instructions on my chart for pt.

## 2023-08-13 NOTE — Telephone Encounter (Signed)
Questionnaire from recall, no referral needed  

## 2023-08-23 ENCOUNTER — Other Ambulatory Visit (HOSPITAL_COMMUNITY)
Admission: RE | Admit: 2023-08-23 | Discharge: 2023-08-23 | Disposition: A | Discharge status: Home / Self Care | Payer: 59 | Source: Ambulatory Visit | Attending: Gastroenterology | Admitting: Gastroenterology

## 2023-08-23 DIAGNOSIS — K5080 Crohn's disease of both small and large intestine without complications: Secondary | ICD-10-CM | POA: Diagnosis present

## 2023-08-23 LAB — BASIC METABOLIC PANEL
Anion gap: 9 (ref 5–15)
BUN: 24 mg/dL — ABNORMAL HIGH (ref 6–20)
CO2: 29 mmol/L (ref 22–32)
Calcium: 9.6 mg/dL (ref 8.9–10.3)
Chloride: 103 mmol/L (ref 98–111)
Creatinine, Ser: 0.78 mg/dL (ref 0.44–1.00)
GFR, Estimated: >60 mL/min (ref >60)
Glucose, Bld: 93 mg/dL (ref 70–99)
Potassium: 4.2 mmol/L (ref 3.5–5.1)
Sodium: 141 mmol/L (ref 135–145)

## 2023-08-28 ENCOUNTER — Other Ambulatory Visit: Payer: Self-pay

## 2023-08-28 ENCOUNTER — Ambulatory Visit (HOSPITAL_COMMUNITY): Payer: 59 | Admitting: Anesthesiology

## 2023-08-28 ENCOUNTER — Ambulatory Visit (HOSPITAL_COMMUNITY)
Admission: RE | Admit: 2023-08-28 | Discharge: 2023-08-28 | Disposition: A | Discharge status: Home / Self Care | Payer: 59 | Attending: Gastroenterology | Admitting: Gastroenterology

## 2023-08-28 ENCOUNTER — Encounter (HOSPITAL_COMMUNITY): Payer: Self-pay | Admitting: Gastroenterology

## 2023-08-28 ENCOUNTER — Encounter (INDEPENDENT_AMBULATORY_CARE_PROVIDER_SITE_OTHER): Payer: Self-pay | Admitting: *Deleted

## 2023-08-28 ENCOUNTER — Encounter (HOSPITAL_COMMUNITY)
Admission: RE | Disposition: A | Discharge status: Home / Self Care | Payer: Self-pay | Source: Home / Self Care | Attending: Gastroenterology

## 2023-08-28 DIAGNOSIS — K5080 Crohn's disease of both small and large intestine without complications: Secondary | ICD-10-CM | POA: Diagnosis present

## 2023-08-28 DIAGNOSIS — K219 Gastro-esophageal reflux disease without esophagitis: Secondary | ICD-10-CM | POA: Diagnosis not present

## 2023-08-28 DIAGNOSIS — Z860100 Personal history of colon polyps, unspecified: Secondary | ICD-10-CM | POA: Diagnosis not present

## 2023-08-28 DIAGNOSIS — Z7722 Contact with and (suspected) exposure to environmental tobacco smoke (acute) (chronic): Secondary | ICD-10-CM | POA: Insufficient documentation

## 2023-08-28 DIAGNOSIS — K648 Other hemorrhoids: Secondary | ICD-10-CM | POA: Diagnosis not present

## 2023-08-28 DIAGNOSIS — I10 Essential (primary) hypertension: Secondary | ICD-10-CM | POA: Diagnosis not present

## 2023-08-28 DIAGNOSIS — K635 Polyp of colon: Secondary | ICD-10-CM

## 2023-08-28 DIAGNOSIS — Z79631 Long term (current) use of antimetabolite agent: Secondary | ICD-10-CM | POA: Diagnosis not present

## 2023-08-28 DIAGNOSIS — D122 Benign neoplasm of ascending colon: Secondary | ICD-10-CM | POA: Insufficient documentation

## 2023-08-28 DIAGNOSIS — Z1211 Encounter for screening for malignant neoplasm of colon: Secondary | ICD-10-CM | POA: Diagnosis not present

## 2023-08-28 HISTORY — PX: COLONOSCOPY WITH PROPOFOL: SHX5780

## 2023-08-28 HISTORY — PX: HEMOSTASIS CLIP PLACEMENT: SHX6857

## 2023-08-28 HISTORY — PX: POLYPECTOMY: SHX5525

## 2023-08-28 LAB — HM COLONOSCOPY

## 2023-08-28 SURGERY — COLONOSCOPY WITH PROPOFOL
Anesthesia: General

## 2023-08-28 MED ORDER — SODIUM CHLORIDE 0.9% FLUSH: 3.0000 mL | Freq: Two times a day (BID) | INTRAVENOUS | Status: DC

## 2023-08-28 MED ORDER — SODIUM CHLORIDE 0.9% FLUSH: 3.0000 mL | INTRAVENOUS | Status: DC | PRN

## 2023-08-28 MED ORDER — LACTATED RINGERS IV SOLN: INTRAVENOUS | Status: DC | PRN

## 2023-08-28 MED ORDER — PROPOFOL 10 MG/ML IV BOLUS
INTRAVENOUS | Status: DC | PRN
  Administered 2023-08-28: 100 mg via INTRAVENOUS

## 2023-08-28 MED ORDER — PHENYLEPHRINE 80 MCG/ML (10ML) SYRINGE FOR IV PUSH (FOR BLOOD PRESSURE SUPPORT)
PREFILLED_SYRINGE | INTRAVENOUS | Status: DC | PRN
  Administered 2023-08-28 (×3): 160 ug via INTRAVENOUS

## 2023-08-28 MED ORDER — PROPOFOL 500 MG/50ML IV EMUL
INTRAVENOUS | Status: DC | PRN
  Administered 2023-08-28: 200 ug/kg/min via INTRAVENOUS

## 2023-08-28 NOTE — H&P (Signed)
 Kristen Cooper is an 58 y.o. female.   Chief Complaint: Ileocolonic Crohn's disease HPI: Kristen Cooper is a 58 y.o. female with past medical history of polymyositis, polyarthritis, GERD complicated by Schatzki's ring and ileocolonic Crohn's disease on infliximab  and methotrexate, coming for surveillance of Crohn;s disease.  The patient denies having any nausea, vomiting, fever, chills, hematochezia, melena, hematemesis, abdominal distention, abdominal pain, diarrhea, jaundice, pruritus or weight loss.   Past Medical History:  Diagnosis Date   Arthritis    Crohn disease [facility]) GI-- dr setzer/ dr golda:   rheumologist-  dr mai   dx 1987   Dermatography 08/2012   To many histamine cells   GERD (gastroesophageal reflux disease)    History of kidney stones    Hypertension    Inflammatory bowel disease    Polymyositis [facility])     Past Surgical History:  Procedure Laterality Date   APPENDECTOMY  1987   BIOPSY  08/09/2021   Procedure: BIOPSY;  Surgeon: Eartha Angelia Sieving, MD;  [facility];  Service: Gastroenterology;;   COLONOSCOPY N/A 10/27/2015   Procedure: COLONOSCOPY;  Surgeon: Claudis RAYMOND Golda, MD;  [facility];  Service: Endoscopy;  Laterality: N/A;  11:00   COLONOSCOPY WITH PROPOFOL  N/A 08/09/2021   Procedure: COLONOSCOPY WITH PROPOFOL ;  Surgeon: Eartha Angelia Sieving, MD;  [facility];  Service: Gastroenterology;  Laterality: N/A;  8:15   CYSTOSCOPY/URETEROSCOPY/HOLMIUM LASER/STENT PLACEMENT Left 01/13/2021   Procedure: CYSTOSCOPY/URETEROSCOPY/REMOVAL OF LEFT RENAL AND LEFT DISTAL STONES/STENT PLACEMENT;  Surgeon: Watt Rush, MD;  [facility];  Service: Urology;  Laterality: Left;   ESOPHAGEAL DILATION N/A 03/03/2020   Procedure: ESOPHAGEAL DILATION;  Surgeon: Golda Claudis RAYMOND, MD;  [facility];  Service: Endoscopy;  Laterality: N/A;   ESOPHAGOGASTRODUODENOSCOPY N/A 03/03/2020   Procedure: ESOPHAGOGASTRODUODENOSCOPY (EGD);   Surgeon: Golda Claudis RAYMOND, MD;  [facility];  Service: Endoscopy;  Laterality: N/A;  1155   MUSCLE BIOPSY Right 01/02/2017   Procedure: UPPER RIGHT  EXTREMITY MUSCLE BIOPSY;  Surgeon: Rubin Calamity, MD;  Location: [facility];  Service: General;  Laterality: Right;   POLYPECTOMY  08/09/2021   Procedure: POLYPECTOMY;  Surgeon: Eartha Angelia Sieving, MD;  [facility];  Service: Gastroenterology;;   UPPER GASTROINTESTINAL ENDOSCOPY  ?   WISDOM TOOTH EXTRACTION      Family History  Problem Relation Age of Onset   Crohn's disease Mother    Irritable bowel syndrome Father    Hypertension Brother    Healthy Brother    Healthy Brother    Social History:  reports that she has never smoked. She has been exposed to tobacco smoke. She has never used smokeless tobacco. She reports that she does not drink alcohol and does not use drugs.  Allergies:  Allergies  Allergen Reactions   Ra Moisturizing Oatmeal [Albolene]     Other reaction(s): hives/oatmeal   Oatmeal Hives and Itching    Medications Prior to Admission  Medication Sig Dispense Refill   acetaminophen  (TYLENOL ) 500 MG tablet Take 500 mg by mouth every 8 (eight) hours as needed for headache.     B-D TB SYRINGE 1CC/27GX1/2 27G X 1/2 1 ML MISC Inject into the skin once a week.     cetirizine (ZYRTEC) 10 MG tablet Take 10 mg by mouth at bedtime.      famotidine-calcium carbonate-magnesium hydroxide (PEPCID COMPLETE) 10-800-165 MG chewable tablet Chew 1 tablet by mouth daily as needed (acid reflux).  fexofenadine (ALLEGRA) 180 MG tablet Take 180 mg by mouth daily.      folic acid (FOLVITE) 800 MCG tablet Take 1,600 mcg by mouth daily.      inFLIXimab -axxq (AVSOLA  IV) Inject 5 mg into the vein every 8 (eight) weeks. 5 mg per Kg Every 8 weeks.(Gets at [facility])     lisinopril-hydrochlorothiazide (PRINZIDE,ZESTORETIC) 10-12.5 MG per tablet Take 0.5 tablets by mouth daily.       methotrexate 50 MG/2ML injection SMARTSIG:0.4 Milliliter(s) SUB-Q Once a Week     Olopatadine HCl (PATADAY) 0.7 % SOLN Place 1 drop into both eyes daily.     Propylene Glycol (SYSTANE BALANCE) 0.6 % SOLN Place 1 application into both eyes daily as needed (Dry eyes).      No results found for this or any previous visit (from the past 48 hours). No results found.  Review of Systems  All other systems reviewed and are negative.   Blood pressure 111/70, pulse 85, temperature 98 F (36.7 C), temperature source Oral, height 5' 2 (1.575 m), weight 48.1 kg, last menstrual period 06/03/2016, SpO2 100%. Physical Exam  GENERAL: The patient is AO x3, in no acute distress. HEENT: Head is normocephalic and atraumatic. EOMI are intact. Mouth is well hydrated and without lesions. NECK: Supple. No masses LUNGS: Clear to auscultation. No presence of rhonchi/wheezing/rales. Adequate chest expansion HEART: RRR, normal s1 and s2. ABDOMEN: Soft, nontender, no guarding, no peritoneal signs, and nondistended. BS +. No masses. EXTREMITIES: Without any cyanosis, clubbing, rash, lesions or edema. NEUROLOGIC: AOx3, no focal motor deficit. SKIN: no jaundice, no rashes  Assessment/Plan Kristen Cooper is a 58 y.o. female with past medical history of polymyositis, polyarthritis, GERD complicated by Schatzki's ring and ileocolonic Crohn's disease on infliximab  and methotrexate, coming for surveillance of Crohn;s disease.  Will proceed with colonoscopy.  Toribio Eartha Flavors, MD 08/28/2023, 12:24 PM

## 2023-08-28 NOTE — Transfer of Care (Signed)
 Immediate Anesthesia Transfer of Care Note  Patient: Kristen Cooper  Procedure(s) Performed: COLONOSCOPY WITH PROPOFOL  POLYPECTOMY HEMOSTASIS CLIP PLACEMENT  Patient Location: Short Stay  Anesthesia Type:General  Level of Consciousness: awake, alert , oriented, and patient cooperative  Airway & Oxygen Therapy: Patient Spontanous Breathing  Post-op Assessment: Report given to RN, Post -op Vital signs reviewed and stable, and Patient moving all extremities X 4  Post vital signs: Reviewed and stable  Last Vitals:  Vitals Value Taken Time  BP 100/52 08/28/23 1304  Temp 36.9 C 08/28/23 1304  Pulse 71 08/28/23 1304  Resp 16 08/28/23 1304  SpO2 96 % 08/28/23 1304    Last Pain:  Vitals:   08/28/23 1304  TempSrc: Oral  PainSc: 0-No pain      Patients Stated Pain Goal: 8 (08/28/23 1158)  Complications: No notable events documented.

## 2023-08-28 NOTE — Anesthesia Postprocedure Evaluation (Signed)
 Anesthesia Post Note  Patient: Kristen Cooper  Procedure(s) Performed: COLONOSCOPY WITH PROPOFOL  POLYPECTOMY HEMOSTASIS CLIP PLACEMENT  Patient location during evaluation: PACU Anesthesia Type: General Level of consciousness: awake and alert Pain management: pain level controlled Vital Signs Assessment: post-procedure vital signs reviewed and stable Respiratory status: spontaneous breathing, nonlabored ventilation, respiratory function stable and patient connected to nasal cannula oxygen Cardiovascular status: blood pressure returned to baseline and stable Postop Assessment: no apparent nausea or vomiting Anesthetic complications: no   There were no known notable events for this encounter.   Last Vitals:  Vitals:   08/28/23 1158 08/28/23 1304  BP: 111/70 (!) 100/52  Pulse: 85 71  Resp:  16  Temp: 36.7 C 36.9 C  SpO2: 100% 96%    Last Pain:  Vitals:   08/28/23 1304  TempSrc: Oral  PainSc: 0-No pain                 Jacqueli Pangallo L Kyjuan Gause

## 2023-08-28 NOTE — Anesthesia Preprocedure Evaluation (Signed)
 Anesthesia Evaluation  Patient identified by MRN, date of birth, ID band Patient awake    Reviewed: Allergy & Precautions, H&P , NPO status , Patient's Chart, lab work & pertinent test results, reviewed documented beta blocker date and time   Airway Mallampati: II  TM Distance: >3 FB Neck ROM: full    Dental no notable dental hx. (+) Dental Advisory Given, Teeth Intact   Pulmonary neg pulmonary ROS   Pulmonary exam normal breath sounds clear to auscultation       Cardiovascular Exercise Tolerance: Good hypertension, negative cardio ROS Normal cardiovascular exam Rhythm:regular Rate:Normal     Neuro/Psych negative neurological ROS  negative psych ROS   GI/Hepatic Neg liver ROS,GERD  ,,Crohn's   Endo/Other  negative endocrine ROS    Renal/GU negative Renal ROS  negative genitourinary   Musculoskeletal   Abdominal   Peds  Hematology negative hematology ROS (+)   Anesthesia Other Findings   Reproductive/Obstetrics negative OB ROS                             Anesthesia Physical Anesthesia Plan  ASA: 2  Anesthesia Plan: General   Post-op Pain Management: Minimal or no pain anticipated   Induction: Intravenous  PONV Risk Score and Plan: Propofol  infusion  Airway Management Planned: Natural Airway and Nasal Cannula  Additional Equipment: None  Intra-op Plan:   Post-operative Plan:   Informed Consent: I have reviewed the patients History and Physical, chart, labs and discussed the procedure including the risks, benefits and alternatives for the proposed anesthesia with the patient or authorized representative who has indicated his/her understanding and acceptance.     Dental Advisory Given  Plan Discussed with: CRNA  Anesthesia Plan Comments:        Anesthesia Quick Evaluation

## 2023-08-28 NOTE — Op Note (Signed)
 [facility] Patient Name: Kristen Cooper Procedure Date: 08/28/2023 12:17 PM MRN: [id number] Date of Birth: Jan 05, 1966 Attending MD: Toribio Fortune , , [id number] CSN: [id number] Age: 58 Admit Type: Outpatient Procedure:                Colonoscopy Indications:              Follow-up of Crohn's disease of the small bowel and                            colon Providers:                Toribio Fortune, Jon LABOR. Gerome RN, RN, Bascom Blush Referring MD:              Medicines:                Monitored Anesthesia Care Complications:            No immediate complications. Estimated Blood Loss:     Estimated blood loss: none. Procedure:                Pre-Anesthesia Assessment:                           - Prior to the procedure, a History and Physical                            was performed, and patient medications, allergies                            and sensitivities were reviewed. The patient's                            tolerance of previous anesthesia was reviewed.                           - The risks and benefits of the procedure and the                            sedation options and risks were discussed with the                            patient. All questions were answered and informed                            consent was obtained.                           - ASA Grade Assessment: II - A patient with mild                            systemic disease.                           After obtaining informed consent, the colonoscope  was passed under direct vision. Throughout the                            procedure, the patient's blood pressure, pulse, and                            oxygen saturations were monitored continuously. The                            [id number]-[id number] ([id number]) was introduced through the                            anus and advanced to the the terminal ileum. The                            colonoscopy was  performed without difficulty. The                            patient tolerated the procedure well. The quality                            of the bowel preparation was good. Scope In: 12:37:18 PM Scope Out: 1:00:10 PM Scope Withdrawal Time: 0 hours 17 minutes 7 seconds  Total Procedure Duration: 0 hours 22 minutes 52 seconds  Findings:      The perianal and digital rectal examinations were normal.      The terminal ileum appeared normal.      A 5 mm polyp was found in the ascending colon. The polyp was flat. The       polyp was removed with a cold snare. Resection and retrieval were       complete. To prevent bleeding after the polypectomy, one hemostatic clip       was successfully placed (MR safe). Clip manufacturer: [employer].       There was no bleeding at the end of the procedure.      Non-bleeding internal hemorrhoids were found during retroflexion. The       hemorrhoids were small. Impression:               - The examined portion of the ileum was normal.                           - One 5 mm polyp in the ascending colon, removed                            with a cold snare. Resected and retrieved. Clip                            manufacturer: [employer]. Clip (MR safe) was                            placed.                           - Non-bleeding internal hemorrhoids. Moderate Sedation:      Per Anesthesia Care Recommendation:           -  Discharge patient to home (ambulatory).                           - Resume previous diet.                           - Await pathology results.                           - Repeat colonoscopy in 3 years for surveillance.                           - Continue present medications. Procedure Code(s):        --- Professional ---                           [phone], Colonoscopy, flexible; with removal of                            tumor(s), polyp(s), or other lesion(s) by snare                            technique Diagnosis Code(s):         --- Professional ---                           K64.8, Other hemorrhoids                           D12.2, Benign neoplasm of ascending colon                           K50.80, Crohn's disease of both small and large                            intestine without complications CPT copyright 2022 [employer]. All rights reserved. The codes documented in this report are preliminary and upon coder review may  be revised to meet current compliance requirements. Toribio Fortune, MD Toribio Fortune,  08/28/2023 1:05:47 PM This report has been signed electronically. Number of Addenda: 0

## 2023-08-28 NOTE — Discharge Instructions (Signed)
 You are being discharged to home.  Resume your previous diet.  We are waiting for your pathology results.  Your physician has recommended a repeat colonoscopy in three years for surveillance.  Continue your present medications.

## 2023-08-29 ENCOUNTER — Encounter (HOSPITAL_COMMUNITY): Payer: Self-pay | Admitting: Gastroenterology

## 2023-08-29 ENCOUNTER — Encounter (INDEPENDENT_AMBULATORY_CARE_PROVIDER_SITE_OTHER): Payer: Self-pay | Admitting: *Deleted

## 2023-08-29 LAB — SURGICAL PATHOLOGY

## 2023-09-02 NOTE — Progress Notes (Signed)
 Polyp was resected from the ascending colon, as stated in the OP.

## 2023-10-04 ENCOUNTER — Telehealth (INDEPENDENT_AMBULATORY_CARE_PROVIDER_SITE_OTHER): Payer: Self-pay | Admitting: *Deleted

## 2023-10-04 NOTE — Telephone Encounter (Signed)
 Order form for remicade by iv 5mg  per kg q 8 weeks was signed by Dr. Levon Hedger and I faxed yesterday on 10/03/23 to [facility] rheumatology and called Lawson Fiscal at [facility] rheumatology and left her a message that the order had been faxed and to call back if she did not receive. Order sent to be scanned to chart.

## 2023-10-21 ENCOUNTER — Encounter (HOSPITAL_COMMUNITY): Payer: Self-pay | Admitting: *Deleted

## 2023-10-21 ENCOUNTER — Emergency Department (HOSPITAL_COMMUNITY)
Admission: EM | Admit: 2023-10-21 | Discharge: 2023-10-21 | Disposition: A | Discharge status: Home / Self Care | Attending: Emergency Medicine | Admitting: Emergency Medicine

## 2023-10-21 ENCOUNTER — Emergency Department (HOSPITAL_COMMUNITY)

## 2023-10-21 ENCOUNTER — Other Ambulatory Visit: Payer: Self-pay

## 2023-10-21 DIAGNOSIS — N201 Calculus of ureter: Secondary | ICD-10-CM

## 2023-10-21 DIAGNOSIS — N132 Hydronephrosis with renal and ureteral calculous obstruction: Secondary | ICD-10-CM | POA: Diagnosis not present

## 2023-10-21 DIAGNOSIS — R1031 Right lower quadrant pain: Secondary | ICD-10-CM | POA: Diagnosis present

## 2023-10-21 LAB — CBC WITH DIFFERENTIAL/PLATELET
Abs Immature Granulocytes: 0.01 10*3/uL (ref 0.00–0.07)
Basophils Absolute: 0 10*3/uL (ref 0.0–0.1)
Basophils Relative: 1 %
Eosinophils Absolute: 0 10*3/uL (ref 0.0–0.5)
Eosinophils Relative: 0 %
HCT: 42.6 % (ref 36.0–46.0)
Hemoglobin: 14.3 g/dL (ref 12.0–15.0)
Immature Granulocytes: 0 %
Lymphocytes Relative: 32 %
Lymphs Abs: 2 10*3/uL (ref 0.7–4.0)
MCH: 32 pg (ref 26.0–34.0)
MCHC: 33.6 g/dL (ref 30.0–36.0)
MCV: 95.3 fL (ref 80.0–100.0)
Monocytes Absolute: 0.4 10*3/uL (ref 0.1–1.0)
Monocytes Relative: 6 %
Neutro Abs: 3.7 10*3/uL (ref 1.7–7.7)
Neutrophils Relative %: 61 %
Platelets: 297 10*3/uL (ref 150–400)
RBC: 4.47 MIL/uL (ref 3.87–5.11)
RDW: 12.2 % (ref 11.5–15.5)
WBC: 6.1 10*3/uL (ref 4.0–10.5)
nRBC: 0 % (ref 0.0–0.2)

## 2023-10-21 LAB — URINALYSIS, W/ REFLEX TO CULTURE (INFECTION SUSPECTED)
Bacteria, UA: NONE SEEN
Bilirubin Urine: NEGATIVE
Glucose, UA: NEGATIVE mg/dL
Ketones, ur: 5 mg/dL — AB
Leukocytes,Ua: NEGATIVE
Nitrite: NEGATIVE
Protein, ur: 100 mg/dL — AB
RBC / HPF: >50 RBC/hpf (ref 0–5)
Specific Gravity, Urine: 1.024 (ref 1.005–1.030)
pH: 5 (ref 5.0–8.0)

## 2023-10-21 LAB — COMPREHENSIVE METABOLIC PANEL WITH GFR
ALT: 30 U/L (ref 0–44)
AST: 27 U/L (ref 15–41)
Albumin: 4 g/dL (ref 3.5–5.0)
Alkaline Phosphatase: 77 U/L (ref 38–126)
Anion gap: 10 (ref 5–15)
BUN: 25 mg/dL — ABNORMAL HIGH (ref 6–20)
CO2: 29 mmol/L (ref 22–32)
Calcium: 9.4 mg/dL (ref 8.9–10.3)
Chloride: 99 mmol/L (ref 98–111)
Creatinine, Ser: 0.83 mg/dL (ref 0.44–1.00)
GFR, Estimated: >60 mL/min (ref >60)
Glucose, Bld: 111 mg/dL — ABNORMAL HIGH (ref 70–99)
Potassium: 3.7 mmol/L (ref 3.5–5.1)
Sodium: 138 mmol/L (ref 135–145)
Total Bilirubin: 2.3 mg/dL — ABNORMAL HIGH (ref 0.0–1.2)
Total Protein: 6.9 g/dL (ref 6.5–8.1)

## 2023-10-21 MED ORDER — HYDROCODONE-ACETAMINOPHEN 5-325 MG PO TABS: 1.0000 | ORAL_TABLET | Freq: Four times a day (QID) | ORAL | 0 refills | Status: DC | PRN

## 2023-10-21 NOTE — ED Notes (Signed)
Pt given urine strainer at discharge.

## 2023-10-21 NOTE — Discharge Instructions (Addendum)
 You develop new or worsening or uncontrolled pain, fever, vomiting, urinary tract infection symptoms, or any other new/concerning symptoms then return to the ER.

## 2023-10-21 NOTE — ED Triage Notes (Signed)
 Pt c/o right flank pain that started this morning and difficulty urinating that started Saturday night.

## 2023-10-21 NOTE — ED Provider Notes (Signed)
 [name] Provider Note   CSN: [id number] Arrival date & time: 10/21/23  1914     History  Chief Complaint  Patient presents with   Flank Pain    Kristen Cooper is a 58 y.o. female.  HPI 58 year old female presents with right flank pain.  Starting 2 nights ago she developed some suprapubic pain as well as a feeling of constantly having to go to the bathroom.  The flank pain started around an hour ago.  She did have a little bit of spitting up but otherwise no vomiting this morning.  No fevers.  The pain has essentially resolved and now is about a 1 out of 10.  She did not take anything for this pain.  She has had a prior kidney stone.  Home Medications Prior to Admission medications   Medication Sig Start Date End Date Taking? Authorizing Provider  HYDROcodone-acetaminophen (NORCO/VICODIN) 5-325 MG tablet Take 1 tablet by mouth every 6 (six) hours as needed for severe pain (pain score 7-10). 10/21/23  Yes Pricilla Loveless, MD  acetaminophen (TYLENOL) 500 MG tablet Take 500 mg by mouth every 8 (eight) hours as needed for headache.    [provider]  B-D TB SYRINGE 1CC/27GX1/2" 27G X 1/2" 1 ML MISC Inject into the skin once a week. 09/26/21   [provider]  cetirizine (ZYRTEC) 10 MG tablet Take 10 mg by mouth at bedtime.     [provider]  famotidine-calcium carbonate-magnesium hydroxide (PEPCID COMPLETE) 10-800-165 MG chewable tablet Chew 1 tablet by mouth daily as needed (acid reflux).    [provider]  fexofenadine (ALLEGRA) 180 MG tablet Take 180 mg by mouth daily.     [provider]  folic acid (FOLVITE) 800 MCG tablet Take 1,600 mcg by mouth daily.     [provider]  inFLIXimab-axxq (AVSOLA IV) Inject 5 mg into the vein every 8 (eight) weeks. 5 mg per Kg Every 8 weeks.(Gets at [facility])    [provider]  lisinopril-hydrochlorothiazide  (PRINZIDE,ZESTORETIC) 10-12.5 MG per tablet Take 0.5 tablets by mouth daily.     [provider]  methotrexate 50 MG/2ML injection SMARTSIG:0.4 Milliliter(s) SUB-Q Once a Week 11/27/21   [provider]  Olopatadine HCl (PATADAY) 0.7 % SOLN Place 1 drop into both eyes daily.    [provider]  Propylene Glycol (SYSTANE BALANCE) 0.6 % SOLN Place 1 application into both eyes daily as needed (Dry eyes).    [provider]  pantoprazole (PROTONIX) 40 MG tablet Take 1 tablet (40 mg total) by mouth daily before breakfast. 03/03/20 05/08/20  Malissa Hippo, MD      Allergies    Ra moisturizing oatmeal [albolene] and Oatmeal    Review of Systems   Review of Systems  Constitutional:  Negative for fever.  Gastrointestinal:  Negative for abdominal pain.  Genitourinary:  Positive for difficulty urinating, flank pain and frequency.    Physical Exam Updated Vital Signs BP (!) 108/53 (BP Location: Left Arm)   Pulse 78   Temp 97.7 F (36.5 C) (Oral)   Resp 18   Ht 5' 2.5" (1.588 m)   Wt 49.9 kg   LMP 06/03/2016 (Approximate)   SpO2 98%   BMI 19.80 kg/m  Physical Exam Vitals and nursing note reviewed.  Constitutional:      General: She is not in acute distress.    Appearance: She is well-developed. She is not ill-appearing or diaphoretic.  HENT:     Head: Normocephalic and atraumatic.  Cardiovascular:     Rate and Rhythm: Normal rate and regular rhythm.     Heart sounds: Normal heart sounds.  Pulmonary:     Effort: Pulmonary effort is normal.     Breath sounds: Normal breath sounds.  Abdominal:     Palpations: Abdomen is soft.     Tenderness: There is no abdominal tenderness. There is right CVA tenderness (no rash).  Skin:    General: Skin is warm and dry.  Neurological:     Mental Status: She is alert.     ED Results / Procedures / Treatments   Labs (all labs ordered are listed, but only abnormal results are displayed) Labs Reviewed   COMPREHENSIVE METABOLIC PANEL WITH GFR - Abnormal; Notable for the following components:      Result Value   Glucose, Bld 111 (*)    BUN 25 (*)    Total Bilirubin 2.3 (*)    All other components within normal limits  URINALYSIS, W/ REFLEX TO CULTURE (INFECTION SUSPECTED) - Abnormal; Notable for the following components:   APPearance HAZY (*)    Hgb urine dipstick LARGE (*)    Ketones, ur 5 (*)    Protein, ur 100 (*)    All other components within normal limits  CBC WITH DIFFERENTIAL/PLATELET    EKG None  Radiology CT Renal Stone Study Result Date: 10/21/2023 CLINICAL DATA:  Flank pain and started this morning with difficulty urinating since Saturday night EXAM: CT ABDOMEN AND PELVIS WITHOUT CONTRAST TECHNIQUE: Multidetector CT imaging of the abdomen and pelvis was performed following the standard protocol without IV contrast. RADIATION DOSE REDUCTION: This exam was performed according to the departmental dose-optimization program which includes automated exposure control, adjustment of the mA and/or kV according to patient size and/or use of iterative reconstruction technique. COMPARISON:  12/09/2020 FINDINGS: Lower chest:  No contributory findings. Hepatobiliary: Numerous hepatic cystic densities primarily scattered in the right lobe liver, pattern stable from before, up to 2.3 cm in the lateral right lobe.No evidence of biliary obstruction or stone. Pancreas: Unremarkable. Spleen: Unremarkable. Adrenals/Urinary Tract: Negative adrenals. Mild right hydroureteronephrosis due to a 3 mm stone just above the UVJ. Two right renal calculi measuring up to 2 mm in the interpolar kidney. Unremarkable bladder. Stomach/Bowel:  No obstruction. No appendicitis. Vascular/Lymphatic: No acute vascular abnormality. No mass or adenopathy. Reproductive:No pathologic findings. Other: No ascites or pneumoperitoneum. Musculoskeletal: No acute abnormalities. IMPRESSION: Mild right hydroureteronephrosis from a 3 mm  distal ureteral stone. Small right renal calculi. Electronically Signed   By: Tiburcio Pea M.D.   On: 10/21/2023 09:43    Procedures Procedures    Medications Ordered in ED Medications - No data to display  ED Course/ Medical Decision Making/ A&P                                 Medical Decision Making Amount and/or Complexity of Data Reviewed Labs: ordered.    Details: Normal WBC Radiology: ordered and independent interpretation performed.    Details: Right ureteral stone and hydronephrosis  Risk Prescription drug management.   Patient's pain is well-controlled here.  CT does confirm a stone.  Based on size should be passable.  She has not required any pain meds here, will give a brief course of as needed hydrocodone and otherwise encourage increased fluids and follow-up with her urologist.  Urine is not consistent  with a UTI.  WBC normal.  Bilirubin is mildly elevated but has had elevated bilirubins before and no obvious findings on CT such as a dilated CBD..  Will discharge home with return precautions.        Final Clinical Impression(s) / ED Diagnoses Final diagnoses:  Right ureteral stone    Rx / DC Orders ED Discharge Orders          Ordered    HYDROcodone-acetaminophen (NORCO/VICODIN) 5-325 MG tablet  Every 6 hours PRN        10/21/23 1009              Pricilla Loveless, MD 10/21/23 1012

## 2023-10-22 ENCOUNTER — Encounter (HOSPITAL_COMMUNITY): Payer: Self-pay

## 2023-10-22 ENCOUNTER — Other Ambulatory Visit: Payer: Self-pay

## 2023-10-22 ENCOUNTER — Emergency Department (HOSPITAL_COMMUNITY)
Admission: EM | Admit: 2023-10-22 | Discharge: 2023-10-22 | Disposition: A | Discharge status: Home / Self Care | Attending: Emergency Medicine | Admitting: Emergency Medicine

## 2023-10-22 DIAGNOSIS — N132 Hydronephrosis with renal and ureteral calculous obstruction: Secondary | ICD-10-CM | POA: Insufficient documentation

## 2023-10-22 DIAGNOSIS — E876 Hypokalemia: Secondary | ICD-10-CM | POA: Diagnosis not present

## 2023-10-22 DIAGNOSIS — N201 Calculus of ureter: Secondary | ICD-10-CM

## 2023-10-22 DIAGNOSIS — D72829 Elevated white blood cell count, unspecified: Secondary | ICD-10-CM | POA: Insufficient documentation

## 2023-10-22 DIAGNOSIS — E871 Hypo-osmolality and hyponatremia: Secondary | ICD-10-CM | POA: Diagnosis not present

## 2023-10-22 DIAGNOSIS — R109 Unspecified abdominal pain: Secondary | ICD-10-CM | POA: Diagnosis present

## 2023-10-22 LAB — CBC WITH DIFFERENTIAL/PLATELET
Abs Immature Granulocytes: 0.04 10*3/uL (ref 0.00–0.07)
Basophils Absolute: 0 10*3/uL (ref 0.0–0.1)
Basophils Relative: 0 %
Eosinophils Absolute: 0 10*3/uL (ref 0.0–0.5)
Eosinophils Relative: 0 %
HCT: 41.8 % (ref 36.0–46.0)
Hemoglobin: 14.1 g/dL (ref 12.0–15.0)
Immature Granulocytes: 0 %
Lymphocytes Relative: 10 %
Lymphs Abs: 1.1 10*3/uL (ref 0.7–4.0)
MCH: 32.3 pg (ref 26.0–34.0)
MCHC: 33.7 g/dL (ref 30.0–36.0)
MCV: 95.7 fL (ref 80.0–100.0)
Monocytes Absolute: 0.7 10*3/uL (ref 0.1–1.0)
Monocytes Relative: 6 %
Neutro Abs: 9.7 10*3/uL — ABNORMAL HIGH (ref 1.7–7.7)
Neutrophils Relative %: 84 %
Platelets: 281 10*3/uL (ref 150–400)
RBC: 4.37 MIL/uL (ref 3.87–5.11)
RDW: 12.1 % (ref 11.5–15.5)
WBC: 11.6 10*3/uL — ABNORMAL HIGH (ref 4.0–10.5)
nRBC: 0 % (ref 0.0–0.2)

## 2023-10-22 LAB — COMPREHENSIVE METABOLIC PANEL WITH GFR
ALT: 31 U/L (ref 0–44)
AST: 32 U/L (ref 15–41)
Albumin: 4.1 g/dL (ref 3.5–5.0)
Alkaline Phosphatase: 79 U/L (ref 38–126)
Anion gap: 15 (ref 5–15)
BUN: 26 mg/dL — ABNORMAL HIGH (ref 6–20)
CO2: 23 mmol/L (ref 22–32)
Calcium: 9 mg/dL (ref 8.9–10.3)
Chloride: 94 mmol/L — ABNORMAL LOW (ref 98–111)
Creatinine, Ser: 1.07 mg/dL — ABNORMAL HIGH (ref 0.44–1.00)
GFR, Estimated: >60 mL/min (ref >60)
Glucose, Bld: 111 mg/dL — ABNORMAL HIGH (ref 70–99)
Potassium: 3 mmol/L — ABNORMAL LOW (ref 3.5–5.1)
Sodium: 132 mmol/L — ABNORMAL LOW (ref 135–145)
Total Bilirubin: 2.3 mg/dL — ABNORMAL HIGH (ref 0.0–1.2)
Total Protein: 7 g/dL (ref 6.5–8.1)

## 2023-10-22 LAB — URINALYSIS, ROUTINE W REFLEX MICROSCOPIC
Bacteria, UA: NONE SEEN
Bilirubin Urine: NEGATIVE
Glucose, UA: NEGATIVE mg/dL
Ketones, ur: 5 mg/dL — AB
Leukocytes,Ua: NEGATIVE
Nitrite: NEGATIVE
Protein, ur: NEGATIVE mg/dL
Specific Gravity, Urine: 1.017 (ref 1.005–1.030)
pH: 5 (ref 5.0–8.0)

## 2023-10-22 MED ORDER — ONDANSETRON 4 MG PO TBDP: 4.0000 mg | ORAL_TABLET | Freq: Three times a day (TID) | ORAL | 0 refills | Status: DC | PRN

## 2023-10-22 MED ORDER — SODIUM CHLORIDE 0.9 % IV BOLUS
1000.0000 mL | Freq: Once | INTRAVENOUS | Status: AC
  Administered 2023-10-22: 1000 mL via INTRAVENOUS

## 2023-10-22 MED ORDER — POTASSIUM CHLORIDE 20 MEQ PO PACK
60.0000 meq | PACK | Freq: Once | ORAL | Status: AC
  Administered 2023-10-22: 60 meq via ORAL
  Filled 2023-10-22: qty 3

## 2023-10-22 MED ORDER — MORPHINE SULFATE (PF) 4 MG/ML IV SOLN
2.0000 mg | Freq: Once | INTRAVENOUS | Status: AC
  Administered 2023-10-22: 2 mg via INTRAVENOUS
  Filled 2023-10-22: qty 1

## 2023-10-22 MED ORDER — KETOROLAC TROMETHAMINE 15 MG/ML IJ SOLN
15.0000 mg | Freq: Once | INTRAMUSCULAR | Status: AC
  Administered 2023-10-22: 15 mg via INTRAVENOUS
  Filled 2023-10-22: qty 1

## 2023-10-22 MED ORDER — KETOROLAC TROMETHAMINE 10 MG PO TABS: 10.0000 mg | ORAL_TABLET | Freq: Four times a day (QID) | ORAL | 0 refills | Status: DC | PRN

## 2023-10-22 MED ORDER — OXYCODONE-ACETAMINOPHEN 5-325 MG PO TABS: 1.0000 | ORAL_TABLET | Freq: Four times a day (QID) | ORAL | 0 refills | Status: DC | PRN

## 2023-10-22 NOTE — ED Notes (Signed)
 Pt reports she did take prescribed pain medication PTA w/o relief. Pt restless and having a hard time sitting still in triage.

## 2023-10-22 NOTE — Discharge Instructions (Addendum)
 Please follow-up closely with urology on an outpatient basis.  Take all medications as directed.  Return to emergency department immediately for any new or worsening symptoms.

## 2023-10-22 NOTE — ED Provider Notes (Signed)
 [facility] AT [facility] Provider Note   CSN: [id number] Arrival date & time: 10/22/23  1147     History  Chief Complaint  Patient presents with   Flank Pain    Kristen Cooper is a 58 y.o. female.  Patient is a 58 year old female who presents emergency department with a chief complaint of worsening right-sided flank pain.  She was evaluated in the emergency department yesterday and diagnosed with a right-sided ureteral stone.  It was 3 mm.  Patient notes that this morning she has been experiencing intractable pain as well as nausea and vomiting.  Patient does admit to decreased urination as well.  She denies any associated chest pain, shortness of breath, dizziness, lightheadedness or syncope.   Flank Pain       Home Medications Prior to Admission medications   Medication Sig Start Date End Date Taking? Authorizing Provider  acetaminophen (TYLENOL) 500 MG tablet Take 500 mg by mouth every 8 (eight) hours as needed for headache.    [provider]  B-D TB SYRINGE 1CC/27GX1/2" 27G X 1/2" 1 ML MISC Inject into the skin once a week. 09/26/21   [provider]  cetirizine (ZYRTEC) 10 MG tablet Take 10 mg by mouth at bedtime.     [provider]  famotidine-calcium carbonate-magnesium hydroxide (PEPCID COMPLETE) 10-800-165 MG chewable tablet Chew 1 tablet by mouth daily as needed (acid reflux).    [provider]  fexofenadine (ALLEGRA) 180 MG tablet Take 180 mg by mouth daily.     [provider]  folic acid (FOLVITE) 800 MCG tablet Take 1,600 mcg by mouth daily.     [provider]  HYDROcodone-acetaminophen (NORCO/VICODIN) 5-325 MG tablet Take 1 tablet by mouth every 6 (six) hours as needed for severe pain (pain score 7-10). 10/21/23   Pricilla Loveless, MD  inFLIXimab-axxq (AVSOLA IV) Inject 5 mg into the vein every 8 (eight) weeks. 5 mg per Kg Every 8 weeks.(Gets at [facility])    [provider]  lisinopril-hydrochlorothiazide (PRINZIDE,ZESTORETIC) 10-12.5 MG per tablet Take 0.5 tablets by mouth daily.     [provider]  methotrexate 50 MG/2ML injection SMARTSIG:0.4 Milliliter(s) SUB-Q Once a Week 11/27/21   [provider]  Olopatadine HCl (PATADAY) 0.7 % SOLN Place 1 drop into both eyes daily.    [provider]  Propylene Glycol (SYSTANE BALANCE) 0.6 % SOLN Place 1 application into both eyes daily as needed (Dry eyes).    [provider]  pantoprazole (PROTONIX) 40 MG tablet Take 1 tablet (40 mg total) by mouth daily before breakfast. 03/03/20 05/08/20  Rehman, Joline Maxcy, MD      Allergies    Ra moisturizing oatmeal [albolene] and Oatmeal    Review of Systems   Review of Systems  Genitourinary:  Positive for flank pain.  All other systems reviewed and are negative.   Physical Exam Updated Vital Signs BP (!) 100/48   Pulse 80   Temp 97.8 F (36.6 C)   Resp (!) 24   Ht 5' 2.5" (1.588 m)   Wt 49.9 kg   LMP 06/03/2016 (Approximate)   SpO2 100%   BMI 19.80 kg/m  Physical Exam Vitals and nursing note reviewed.  Constitutional:      Appearance: Normal appearance.  HENT:     Head: Normocephalic and atraumatic.     Nose: Nose normal.     Mouth/Throat:     Mouth: Mucous membranes are moist.  Eyes:  Extraocular Movements: Extraocular movements intact.     Conjunctiva/sclera: Conjunctivae normal.     Pupils: Pupils are equal, round, and reactive to light.  Cardiovascular:     Rate and Rhythm: Normal rate and regular rhythm.     Pulses: Normal pulses.     Heart sounds: Normal heart sounds. No murmur heard.    No gallop.  Pulmonary:     Effort: Pulmonary effort is normal. No respiratory distress.     Breath sounds: Normal breath sounds. No wheezing or rales.  Abdominal:     General: Abdomen is flat. Bowel sounds are normal. There is no distension.     Palpations: Abdomen is soft.     Tenderness: There is no  guarding.     Comments: Tenderness palpation of right side of abdomen and flank  Musculoskeletal:        General: Normal range of motion.     Cervical back: Normal range of motion and neck supple.  Skin:    General: Skin is warm and dry.     Findings: No rash.  Neurological:     General: No focal deficit present.     Mental Status: She is alert and oriented to person, place, and time. Mental status is at baseline.  Psychiatric:        Mood and Affect: Mood normal.        Behavior: Behavior normal.        Thought Content: Thought content normal.        Judgment: Judgment normal.     ED Results / Procedures / Treatments   Labs (all labs ordered are listed, but only abnormal results are displayed) Labs Reviewed  COMPREHENSIVE METABOLIC PANEL WITH GFR - Abnormal; Notable for the following components:      Result Value   Sodium 132 (*)    Potassium 3.0 (*)    Chloride 94 (*)    Glucose, Bld 111 (*)    BUN 26 (*)    Creatinine, Ser 1.07 (*)    Total Bilirubin 2.3 (*)    All other components within normal limits  CBC WITH DIFFERENTIAL/PLATELET - Abnormal; Notable for the following components:   WBC 11.6 (*)    Neutro Abs 9.7 (*)    All other components within normal limits  URINALYSIS, ROUTINE W REFLEX MICROSCOPIC    EKG None  Radiology CT Renal Stone Study Result Date: 10/21/2023 CLINICAL DATA:  Flank pain and started this morning with difficulty urinating since Saturday night EXAM: CT ABDOMEN AND PELVIS WITHOUT CONTRAST TECHNIQUE: Multidetector CT imaging of the abdomen and pelvis was performed following the standard protocol without IV contrast. RADIATION DOSE REDUCTION: This exam was performed according to the departmental dose-optimization program which includes automated exposure control, adjustment of the mA and/or kV according to patient size and/or use of iterative reconstruction technique. COMPARISON:  12/09/2020 FINDINGS: Lower chest:  No contributory findings.  Hepatobiliary: Numerous hepatic cystic densities primarily scattered in the right lobe liver, pattern stable from before, up to 2.3 cm in the lateral right lobe.No evidence of biliary obstruction or stone. Pancreas: Unremarkable. Spleen: Unremarkable. Adrenals/Urinary Tract: Negative adrenals. Mild right hydroureteronephrosis due to a 3 mm stone just above the UVJ. Two right renal calculi measuring up to 2 mm in the interpolar kidney. Unremarkable bladder. Stomach/Bowel:  No obstruction. No appendicitis. Vascular/Lymphatic: No acute vascular abnormality. No mass or adenopathy. Reproductive:No pathologic findings. Other: No ascites or pneumoperitoneum. Musculoskeletal: No acute abnormalities. IMPRESSION: Mild right hydroureteronephrosis from a 3  mm distal ureteral stone. Small right renal calculi. Electronically Signed   By: Tiburcio Pea M.D.   On: 10/21/2023 09:43    Procedures Procedures    Medications Ordered in ED Medications  morphine (PF) 4 MG/ML injection 2 mg (2 mg Intravenous Given 10/22/23 1300)  ketorolac (TORADOL) 15 MG/ML injection 15 mg (15 mg Intravenous Given 10/22/23 1300)  sodium chloride 0.9 % bolus 1,000 mL (1,000 mLs Intravenous New Bag/Given 10/22/23 1301)  potassium chloride (KLOR-CON) packet 60 mEq (60 mEq Oral Given 10/22/23 1316)    ED Course/ Medical Decision Making/ A&P                                 Medical Decision Making Amount and/or Complexity of Data Reviewed Labs: ordered.  Risk Prescription drug management.   This patient presents to the ED for concern of right-sided flank pain differential diagnosis includes ureteral stone, pyelonephritis, AKI, electrolyte derangement    Additional history obtained:  Additional history obtained from medical records External records from outside source obtained and reviewed including none   Lab Tests:  I Ordered, and personally interpreted labs.  The pertinent results include: Hypokalemia, hyponatremia, mild  elevated creatinine, hematuria, mild leukocytosis   Medicines ordered and prescription drug management:  I ordered medication including Percocet, Toradol, Zofran for ureteral stone Reevaluation of the patient after these medicines showed that the patient improved I have reviewed the patients home medicines and have made adjustments as needed   Problem List / ED Course:  Patient symptoms have greatly improved with treatment in the emergency department and pain has resolved.  Patient's blood work demonstrates creatinine which is fairly consistent with previous.  Electrolytes were repleted in the emergency department.  Patient has had no further nausea or vomiting and is tolerating p.o. intake without difficulty.  Will plan for changing her home p.o. medications to help with the pain with her ureteral stone.  The stone was small in nature with only mild hydronephrosis yesterday and do not suspect that repeat imaging is warranted.  Urinalysis demonstrates no indication for urinary tract infection.  Vital signs are stable at this point and blood pressure has improved with IV fluids.  Strict return precautions were provided for any new or worsening symptoms.  Patient voiced understanding had no additional questions.   Social Determinants of Health:  None           Final Clinical Impression(s) / ED Diagnoses Final diagnoses:  None    Rx / DC Orders ED Discharge Orders     None         Kathlen Mody 10/22/23 1504    Gloris Manchester, MD 10/22/23 1730

## 2023-10-22 NOTE — ED Triage Notes (Signed)
 Pt arrived via POV c/o right flank pain. Pt seen here yesterday for same complaint. Pt reports the pain has increased and she has began having emesis.

## 2023-11-05 ENCOUNTER — Ambulatory Visit: Admitting: Urology

## 2023-11-26 ENCOUNTER — Other Ambulatory Visit: Payer: Self-pay

## 2023-11-26 DIAGNOSIS — N200 Calculus of kidney: Secondary | ICD-10-CM

## 2023-12-05 ENCOUNTER — Ambulatory Visit: Payer: Self-pay | Admitting: Urology

## 2023-12-05 LAB — STONE ANALYSIS
Calcium Oxalate Monohydrate: 60 %
Uric Acid Calculi: 40 %
Weight Calculi: 14 mg

## 2023-12-19 ENCOUNTER — Ambulatory Visit (HOSPITAL_COMMUNITY)
Admission: RE | Admit: 2023-12-19 | Discharge: 2023-12-19 | Disposition: A | Discharge status: Home / Self Care | Source: Ambulatory Visit | Attending: Urology | Admitting: Urology

## 2023-12-19 ENCOUNTER — Other Ambulatory Visit: Payer: Self-pay | Admitting: Urology

## 2023-12-19 DIAGNOSIS — N200 Calculus of kidney: Secondary | ICD-10-CM | POA: Insufficient documentation

## 2023-12-19 DIAGNOSIS — N3281 Overactive bladder: Secondary | ICD-10-CM | POA: Insufficient documentation

## 2023-12-19 DIAGNOSIS — N952 Postmenopausal atrophic vaginitis: Secondary | ICD-10-CM | POA: Insufficient documentation

## 2023-12-19 NOTE — Progress Notes (Unsigned)
 Name: Kristen Cooper DOB: [birth year] MRN: [id number]  History of Present Illness: Kristen Cooper is a 58 y.o. female who presents today for follow up visit at [facility]. GU History includes: 1. Kidney stones.  - Has had prior ureteroscopic stone manipulation. 2. OAB with urinary frequency, nocturia, urgency, and urge incontinence. - Previously took Ditropan (Oxybutynin) 10 mg daily. 3. Prior urethral dilation (distant past). 4. Vaginal atrophy with introital stenosis. - Uses topical vaginal estrogen cream 2x/week.  At last visit with Dr. Inga Manges on 04/26/2022: Doing well.   Since last visit: > 10/21/2023: CT stone showed a 3 mm right UVJ stone with mild hydroureteronephrosis. Also two punctate right intrarenal stones.  > 11/26/2023: Stone analysis showed 60% calcium oxalate and 40% uric acid composition.  Today: KUB today: Awaiting radiology read; right UVJ stone not appreciated per provider interpretation.  She reports that she passed a stone on 10/25/2023. She denies flank pain or abdominal pain.  She denies any urinary complaints today - no bothersome urinary urgency, frequency, nocturia, or incontinence. Denies dysuria, gross hematuria, hesitancy, straining to void, or sensations of incomplete emptying.   Medications: Current Outpatient Medications  Medication Sig Dispense Refill   acetaminophen  (TYLENOL ) 500 MG tablet Take 500 mg by mouth every 8 (eight) hours as needed for headache.     B-D TB SYRINGE 1CC/27GX1/2" 27G X 1/2" 1 ML MISC Inject into the skin once a week.     cetirizine (ZYRTEC) 10 MG tablet Take 10 mg by mouth at bedtime.      estradiol  (ESTRACE ) 0.1 MG/GM vaginal cream Place 1 Applicatorful vaginally 2 (two) times a week.     famotidine-calcium carbonate-magnesium hydroxide (PEPCID COMPLETE) 10-800-165 MG chewable tablet Chew 1 tablet by mouth daily as needed (acid reflux).     fexofenadine (ALLEGRA) 180 MG tablet Take 180 mg by mouth daily.       folic acid (FOLVITE) 800 MCG tablet Take 1,600 mcg by mouth daily.      inFLIXimab -axxq (AVSOLA  IV) Inject 5 mg into the vein every 8 (eight) weeks. 5 mg per Kg Every 8 weeks.(Gets at [facility])     lisinopril-hydrochlorothiazide (PRINZIDE,ZESTORETIC) 10-12.5 MG per tablet Take 0.5 tablets by mouth daily.      methotrexate 50 MG/2ML injection SMARTSIG:0.4 Milliliter(s) SUB-Q Once a Week     Olopatadine HCl (PATADAY) 0.7 % SOLN Place 1 drop into both eyes daily.     Propylene Glycol (SYSTANE BALANCE) 0.6 % SOLN Place 1 application into both eyes daily as needed (Dry eyes).     HYDROcodone -acetaminophen  (NORCO/VICODIN) 5-325 MG tablet Take 1 tablet by mouth every 6 (six) hours as needed for severe pain (pain score 7-10). (Patient not taking: Reported on 12/20/2023) 10 tablet 0   ketorolac  (TORADOL ) 10 MG tablet Take 1 tablet (10 mg total) by mouth every 6 (six) hours as needed. (Patient not taking: Reported on 12/20/2023) 15 tablet 0   ondansetron  (ZOFRAN -ODT) 4 MG disintegrating tablet Take 1 tablet (4 mg total) by mouth every 8 (eight) hours as needed for nausea or vomiting. (Patient not taking: Reported on 12/20/2023) 20 tablet 0   oxyCODONE -acetaminophen  (PERCOCET/ROXICET) 5-325 MG tablet Take 1 tablet by mouth every 6 (six) hours as needed for severe pain (pain score 7-10). (Patient not taking: Reported on 12/20/2023) 15 tablet 0   No current facility-administered medications for this visit.    Allergies: Allergies  Allergen Reactions   Ra Moisturizing Oatmeal [Albolene]     Other reaction(s): hives/oatmeal  Oatmeal Hives and Itching    Past Medical History:  Diagnosis Date   Arthritis    Crohn disease (HCC) GI-- dr setzer/ dr Homero Luster:   rheumologist-  dr Ebbie Goldmann   dx 1987   Dermatography 08/2012   To many histamine cells   GERD (gastroesophageal reflux disease)    History of kidney stones    Hypertension    Inflammatory bowel disease    Polymyositis [facility])    Past  Surgical History:  Procedure Laterality Date   APPENDECTOMY  1987   BIOPSY  08/09/2021   Procedure: BIOPSY;  Surgeon: Urban Garden, MD;  Location: [facility];  Service: Gastroenterology;;   COLONOSCOPY N/A 10/27/2015   Procedure: COLONOSCOPY;  Surgeon: Ruby Corporal, MD;  Location: [facility];  Service: Endoscopy;  Laterality: N/A;  11:00   COLONOSCOPY WITH PROPOFOL  N/A 08/09/2021   Procedure: COLONOSCOPY WITH PROPOFOL ;  Surgeon: Urban Garden, MD;  Location: [facility];  Service: Gastroenterology;  Laterality: N/A;  8:15   COLONOSCOPY WITH PROPOFOL  N/A 08/28/2023   Procedure: COLONOSCOPY WITH PROPOFOL ;  Surgeon: Urban Garden, MD;  Location: [facility];  Service: Gastroenterology;  Laterality: N/A;  1:15pm;asa 1   CYSTOSCOPY/URETEROSCOPY/HOLMIUM LASER/STENT PLACEMENT Left 01/13/2021   Procedure: CYSTOSCOPY/URETEROSCOPY/REMOVAL OF LEFT RENAL AND LEFT DISTAL STONES/STENT PLACEMENT;  Surgeon: Homero Luster, MD;  Location: [facility];  Service: Urology;  Laterality: Left;   ESOPHAGEAL DILATION N/A 03/03/2020   Procedure: ESOPHAGEAL DILATION;  Surgeon: Ruby Corporal, MD;  [facility]: [facility];  Service: Endoscopy;  Laterality: N/A;   ESOPHAGOGASTRODUODENOSCOPY N/A 03/03/2020   Procedure: ESOPHAGOGASTRODUODENOSCOPY (EGD);  Surgeon: Ruby Corporal, MD;  [facility]: [facility];  Service: Endoscopy;  Laterality: N/A;  1155   HEMOSTASIS CLIP PLACEMENT  08/28/2023   Procedure: HEMOSTASIS CLIP PLACEMENT;  Surgeon: Urban Garden, MD;  Location: [facility];  Service: Gastroenterology;;   MUSCLE BIOPSY Right 01/02/2017   Procedure: UPPER RIGHT  EXTREMITY MUSCLE BIOPSY;  Surgeon: Shela Derby, MD;  Location: [facility];  Service: General;  Laterality: Right;   POLYPECTOMY  08/09/2021   Procedure: POLYPECTOMY;  Surgeon: Urban Garden, MD;  [facility];  Service: Gastroenterology;;   POLYPECTOMY  08/28/2023    Procedure: POLYPECTOMY;  Surgeon: Urban Garden, MD;  [facility];  Service: Gastroenterology;;   UPPER GASTROINTESTINAL ENDOSCOPY  ?   WISDOM TOOTH EXTRACTION     Family History  Problem Relation Age of Onset   Crohn's disease Mother    Irritable bowel syndrome Father    Hypertension Brother    Healthy Brother    Healthy Brother    Social History   Socioeconomic History   Marital status: Married    Spouse name: Not on file   Number of children: Not on file   Years of education: Not on file   Highest education level: Not on file  Occupational History   Not on file  Tobacco Use   Smoking status: Never    Passive exposure: Past   Smokeless tobacco: Never   Tobacco comments:    Exposed as a child   Vaping Use   Vaping status: Never Used  Substance and Sexual Activity   Alcohol use: No    Alcohol/week: 0.0 standard drinks of alcohol   Drug use: No   Sexual activity: Not on file  Other Topics Concern   Not on file  Social History Narrative   Not on file  Social Drivers of Corporate investment banker Strain: Not on file  Food Insecurity: Not on file  Transportation Needs: Not on file  Physical Activity: Not on file  Stress: Not on file  Social Connections: Not on file  Intimate Partner Violence: Not on file    SUBJECTIVE  Review of Systems Constitutional: Patient denies any unintentional weight loss or change in strength lntegumentary: Patient denies any rashes or pruritus Cardiovascular: Patient denies chest pain or syncope Respiratory: Patient denies shortness of breath Gastrointestinal: Patient denies nausea or vomiting Musculoskeletal: Patient denies muscle cramps or weakness Neurologic: Patient denies convulsions or seizures Allergic/Immunologic: Patient denies recent allergic reaction(s) Hematologic/Lymphatic: Patient denies bleeding tendencies Endocrine: Patient denies heat/cold intolerance  GU: As per  HPI.  OBJECTIVE Vitals:   12/20/23 0844  BP: 101/66  Pulse: 84   There is no height or weight on file to calculate BMI.  Physical Examination Constitutional: No obvious distress; patient is non-toxic appearing  Cardiovascular: No visible lower extremity edema.  Respiratory: The patient does not have audible wheezing/stridor; respirations do not appear labored  Gastrointestinal: Abdomen non-distended Musculoskeletal: Normal ROM of UEs  Skin: No obvious rashes/open sores  Neurologic: CN 2-12 grossly intact Psychiatric: Answered questions appropriately with normal affect  Hematologic/Lymphatic/Immunologic: No obvious bruises or sites of spontaneous bleeding  Urine microscopy: unremarkable PVR: 0 ml  ASSESSMENT Kidney stones - Plan: Urinalysis, Routine w reflex microscopic, BLADDER SCAN AMB NON-IMAGING, DG Abd 1 View, CANCELED: DG Abd 1 View  Overactive bladder - Plan: Urinalysis, Routine w reflex microscopic, BLADDER SCAN AMB NON-IMAGING  Vaginal atrophy - Plan: Urinalysis, Routine w reflex microscopic, BLADDER SCAN AMB NON-IMAGING  We reviewed recent imaging results; awaiting radiology results, appears to have no acute findings per provider interpretation.  For stone prevention: Advised adequate hydration and we discussed option to consider low oxalate diet given that calcium oxalate is the most common type of stone. Handout provided about stone prevention diet.  Will plan to follow up in 6 months with K[facility] for stone surveillance or sooner if needed. Patient verbalized understanding of and agreement with current plan. All questions were answered.  PLAN Advised the following: Maintain adequate fluid intake daily. Return in about 1 year (around 12/19/2024) for KUB, UA, & f/u with Griselda Lederer NP.  Orders Placed This Encounter  Procedures   DG Abd 1 View    Standing Status:   Future    Expected Date:   12/14/2024    Expiration Date:   12/19/2024    Reason for Exam (SYMPTOM  OR  DIAGNOSIS REQUIRED):   kidney stone    Is patient pregnant?:   No    Preferred imaging location?:   [facility]   Urinalysis, Routine w reflex microscopic   BLADDER SCAN AMB NON-IMAGING    It has been explained that the patient is to follow regularly with their PCP in addition to all other providers involved in their care and to follow instructions provided by these respective offices. Patient advised to contact urology clinic if any urologic-pertaining questions, concerns, new symptoms or problems arise in the interim period.  There are no Patient Instructions on file for this visit.  Electronically signed by:  Lauretta Ponto, MSN, FNP-C, CUNP 12/20/2023 9:09 AM

## 2023-12-20 ENCOUNTER — Encounter: Payer: Self-pay | Admitting: Urology

## 2023-12-20 ENCOUNTER — Ambulatory Visit: Admitting: Urology

## 2023-12-20 VITALS — BP 101/66 | HR 84

## 2023-12-20 DIAGNOSIS — N200 Calculus of kidney: Secondary | ICD-10-CM | POA: Diagnosis not present

## 2023-12-20 DIAGNOSIS — N952 Postmenopausal atrophic vaginitis: Secondary | ICD-10-CM

## 2023-12-20 DIAGNOSIS — N3281 Overactive bladder: Secondary | ICD-10-CM

## 2023-12-20 LAB — MICROSCOPIC EXAMINATION
Bacteria, UA: NONE SEEN
WBC, UA: NONE SEEN /HPF (ref 0–5)

## 2023-12-20 LAB — URINALYSIS, ROUTINE W REFLEX MICROSCOPIC
Bilirubin, UA: NEGATIVE
Glucose, UA: NEGATIVE
Ketones, UA: NEGATIVE
Leukocytes,UA: NEGATIVE
Nitrite, UA: NEGATIVE
Protein,UA: NEGATIVE
Specific Gravity, UA: 1.025 (ref 1.005–1.030)
Urobilinogen, Ur: 0.2 mg/dL (ref 0.2–1.0)
pH, UA: 6 (ref 5.0–7.5)

## 2023-12-20 LAB — BLADDER SCAN AMB NON-IMAGING: Scan Result: 0

## 2023-12-31 ENCOUNTER — Ambulatory Visit: Payer: Self-pay

## 2024-02-13 ENCOUNTER — Ambulatory Visit (INDEPENDENT_AMBULATORY_CARE_PROVIDER_SITE_OTHER): Payer: BC Managed Care – PPO | Admitting: Gastroenterology

## 2024-02-13 ENCOUNTER — Encounter (INDEPENDENT_AMBULATORY_CARE_PROVIDER_SITE_OTHER): Payer: Self-pay | Admitting: Gastroenterology

## 2024-02-13 VITALS — BP 104/68 | HR 65 | Temp 98.2°F | Ht 62.5 in | Wt 109.7 lb

## 2024-02-13 DIAGNOSIS — K5090 Crohn's disease, unspecified, without complications: Secondary | ICD-10-CM | POA: Diagnosis not present

## 2024-02-13 DIAGNOSIS — K5080 Crohn's disease of both small and large intestine without complications: Secondary | ICD-10-CM

## 2024-02-13 NOTE — Patient Instructions (Addendum)
 We will continue with infliximab  every 8 weeks as you are doing and methotrexate per recommendations from your rheumatologist    You are up to date on labs and colonoscopy We will plan for 1 year follow up, please do not hesitate to reach out sooner if you have any new GI issues  It was a pleasure to see you today. I want to create trusting relationships with patients and provide genuine, compassionate, and quality care. I truly value your feedback! please be on the lookout for a survey regarding your visit with me today. I appreciate your input about our visit and your time in completing this!    Abbigail Anstey L. Torrin Frein, MSN, APRN, AGNP-C Adult-Gerontology Nurse Practitioner [facility] at [facility]

## 2024-02-13 NOTE — Progress Notes (Signed)
 Referring Provider: Jolee Greig VEAR DEVONNA Primary Care Physician:  Jolee Greig VEAR DEVONNA Primary GI Physician: Dr. Eartha   Chief Complaint  Patient presents with   Follow-up    Pt arrives for follow up. No issues/concerns at this time.    HPI:   Kristen Cooper is a 58 y.o. female with past medical history of polymyositis, polyarthritis, GERD complicated by Schatzki's ring and ileocolonic Crohn's disease on infliximab  and methotrexate   Patient presenting today for:  Follow up of Crohn's disease  Last seen July 2024, at that time doing well. Having 1-2 BMs per day, stools formed. No abdominal pain, rectal bleeding.  Continue with inflixiumab 5mg /kg Q8w, continue methotrexate at reccomendations of Rheumatology, repeat TCS jan 2025  Colonoscopy on 08/28/23:  - The examined portion of the ileum was normal.                           - One 5 mm polyp in the ascending colon, removed                            with a cold snare. Resected and retrieved. Clip                            manufacturer: [employer]. Clip (MR safe) was                            placed.                           - Non-bleeding internal hemorrhoids. CECUM, POLYPECTOMY:       Polypoid colonic mucosa without significant diagnostic alteration.       Negative for dysplasia.   Present: States doing well on methotrexate and infliximab . having 1-2 solid stools per day without abdominal pain. Appetite is good. No rectal bleeding or melena. She is feeling very good from GI standpoint. Seeing Rheumatology regularly and having routine labs with them.  Labs this month with normal CBC, CMP grossly unremarkable, Hep B and TB testing in march 2025 were negative  Extraintestinal Manifestations: Skin: none  Joints: none  Eyes: none   Last flu shot: not interested Last pneumonia shot: never, not interested Last Pap smear: June 2025, has to have repeat again in January  Last eye doctor visit: due in December, she is going to  set this up Last evaluation by dermatology: due for follow up with them, she saw them last around oct/nov 2024  Last zoster vaccine: never, not interested COVID-19 shot: never, not interested Mammogram: 2025, normal   Repeat TCS 3 years   [facility] Weights   02/13/24 0842  Weight: 109 lb 11.2 oz (49.8 kg)     Past Medical History:  Diagnosis Date   Arthritis    Crohn disease (HCC) GI-- dr setzer/ dr golda:   rheumologist-  dr mai   dx 1987   Dermatography 08/2012   To many histamine cells   GERD (gastroesophageal reflux disease)    History of kidney stones    Hypertension    Inflammatory bowel disease    Polymyositis [facility])     Past Surgical History:  Procedure Laterality Date   APPENDECTOMY  1987   BIOPSY  08/09/2021   Procedure: BIOPSY;  Surgeon: Eartha Flavors,  Toribio, MD;  Location: [facility];  Service: Gastroenterology;;   COLONOSCOPY N/A 10/27/2015   Procedure: COLONOSCOPY;  Surgeon: Claudis RAYMOND Rivet, MD;  [facility]: [facility];  Service: Endoscopy;  Laterality: N/A;  11:00   COLONOSCOPY WITH PROPOFOL  N/A 08/09/2021   Procedure: COLONOSCOPY WITH PROPOFOL ;  Surgeon: Eartha Angelia Toribio, MD;  Location: [facility];  Service: Gastroenterology;  Laterality: N/A;  8:15   COLONOSCOPY WITH PROPOFOL  N/A 08/28/2023   Procedure: COLONOSCOPY WITH PROPOFOL ;  Surgeon: Eartha Angelia Toribio, MD;  Location: [facility];  Service: Gastroenterology;  Laterality: N/A;  1:15pm;asa 1   CYSTOSCOPY/URETEROSCOPY/HOLMIUM LASER/STENT PLACEMENT Left 01/13/2021   Procedure: CYSTOSCOPY/URETEROSCOPY/REMOVAL OF LEFT RENAL AND LEFT DISTAL STONES/STENT PLACEMENT;  Surgeon: Watt Rush, MD;  Location: [facility];  Service: Urology;  Laterality: Left;   ESOPHAGEAL DILATION N/A 03/03/2020   Procedure: ESOPHAGEAL DILATION;  Surgeon: Rivet Claudis RAYMOND, MD;  [facility]: [facility];  Service: Endoscopy;  Laterality: N/A;   ESOPHAGOGASTRODUODENOSCOPY N/A 03/03/2020   Procedure:  ESOPHAGOGASTRODUODENOSCOPY (EGD);  Surgeon: Rivet Claudis RAYMOND, MD;  [facility]: [facility];  Service: Endoscopy;  Laterality: N/A;  1155   HEMOSTASIS CLIP PLACEMENT  08/28/2023   Procedure: HEMOSTASIS CLIP PLACEMENT;  Surgeon: Eartha Angelia Toribio, MD;  [facility]: [facility];  Service: Gastroenterology;;   MUSCLE BIOPSY Right 01/02/2017   Procedure: UPPER RIGHT  EXTREMITY MUSCLE BIOPSY;  Surgeon: Rubin Calamity, MD;  Location: [facility];  Service: General;  Laterality: Right;   POLYPECTOMY  08/09/2021   Procedure: POLYPECTOMY;  Surgeon: Eartha Angelia Toribio, MD;  [facility];  Service: Gastroenterology;;   POLYPECTOMY  08/28/2023   Procedure: POLYPECTOMY;  Surgeon: Eartha Angelia Toribio, MD;  [facility]: [facility];  Service: Gastroenterology;;   UPPER GASTROINTESTINAL ENDOSCOPY  ?   WISDOM TOOTH EXTRACTION      Current Outpatient Medications  Medication Sig Dispense Refill   acetaminophen  (TYLENOL ) 500 MG tablet Take 500 mg by mouth every 8 (eight) hours as needed for headache.     B-D TB SYRINGE 1CC/27GX1/2 27G X 1/2 1 ML MISC Inject into the skin once a week.     cetirizine (ZYRTEC) 10 MG tablet Take 10 mg by mouth at bedtime.      estradiol  (ESTRACE ) 0.1 MG/GM vaginal cream Place 1 Applicatorful vaginally 2 (two) times a week.     famotidine-calcium carbonate-magnesium hydroxide (PEPCID COMPLETE) 10-800-165 MG chewable tablet Chew 1 tablet by mouth daily as needed (acid reflux).     fexofenadine (ALLEGRA) 180 MG tablet Take 180 mg by mouth daily.      folic acid (FOLVITE) 800 MCG tablet Take 1,600 mcg by mouth daily.      inFLIXimab -axxq (AVSOLA  IV) Inject 5 mg into the vein every 8 (eight) weeks. 5 mg per Kg Every 8 weeks.(Gets at [facility] Rhe[facility]ology)     lisinopril-hydrochlorothiazide (PRINZIDE,ZESTORETIC) 10-12.5 MG per tablet Take 0.5 tablets by mouth daily.      methotrexate 50 MG/2ML injection SMARTSIG:0.4 Milliliter(s) SUB-Q Once a  Week     Olopatadine HCl (PATADAY) 0.7 % SOLN Place 1 drop into both eyes daily.     Propylene Glycol (SYSTANE BALANCE) 0.6 % SOLN Place 1 application into both eyes daily as needed (Dry eyes).     No current facility-administered medications for this visit.    Allergies as of 02/13/2024 - Review Complete 02/13/2024  Allergen Reaction Noted   Ra moisturizing oatmeal [albolene]  06/26/2021   Oatmeal Hives and Itching 05/01/2016  Social History   Socioeconomic History   Marital status: Married    Spouse name: Not on file   Number of children: Not on file   Years of education: Not on file   Highest education level: Not on file  Occupational History   Not on file  Tobacco Use   Smoking status: Never    Passive exposure: Past   Smokeless tobacco: Never   Tobacco comments:    Exposed as a child   Vaping Use   Vaping status: Never Used  Substance and Sexual Activity   Alcohol use: No    Alcohol/week: 0.0 standard drinks of alcohol   Drug use: No   Sexual activity: Not on file  Other Topics Concern   Not on file  Social History Narrative   Not on file   Social Drivers of Health   Financial Resource Strain: Not on file  Food Insecurity: Not on file  Transportation Needs: Not on file  Physical Activity: Not on file  Stress: Not on file  Social Connections: Not on file    Review of systems General: negative for malaise, night sweats, fever, chills, weight loss Neck: Negative for lumps, goiter, pain and significant neck swelling Resp: Negative for cough, wheezing, dyspnea at rest CV: Negative for chest pain, leg swelling, palpitations, orthopnea GI: denies melena, hematochezia, nausea, vomiting, diarrhea, constipation, dysphagia, odyonophagia, early satiety or unintentional weight loss.  MSK: Negative for joint pain or swelling, back pain, and muscle pain. Derm: Negative for itching or rash Psych: Denies depression, anxiety, memory loss, confusion. No homicidal or  suicidal ideation.  Heme: Negative for prolonged bleeding, bruising easily, and swollen nodes. Endocrine: Negative for cold or heat intolerance, polyuria, polydipsia and goiter. Neuro: negative for tremor, gait imbalance, syncope and seizures. The remainder of the review of systems is noncontributory.  Physical Exam: BP 104/68   Pulse 65   Temp 98.2 F (36.8 C)   Ht 5' 2.5 (1.588 m)   Wt 109 lb 11.2 oz (49.8 kg)   LMP 06/03/2016 (Approximate)   BMI 19.74 kg/m  General:   Alert and oriented. No distress noted. Pleasant and cooperative.  Head:  Normocephalic and atraumatic. Eyes:  Conjuctiva clear without scleral icterus. Mouth:  Oral mucosa pink and moist. Good dentition. No lesions. Heart: Normal rate and rhythm, s1 and s2 heart sounds present.  Lungs: Clear lung sounds in all lobes. Respirations equal and unlabored. Abdomen:  +BS, soft, non-tender and non-distended. No rebound or guarding. No HSM or masses noted. Derm: No palmar erythema or jaundice Msk:  Symmetrical without gross deformities. Normal posture. Extremities:  Without edema. Neurologic:  Alert and  oriented x4 Psych:  Alert and cooperative. Normal mood and affect.  Invalid input(s): 6 MONTHS   ASSESSMENT: Kristen Cooper is a 58 y.o. female presenting today for follow up of Crohn's disease.  Patient is maintained on infliximab  5mg /kg every 8 weeks, also on methotrexate managed by rheumatology. Having 1-2 formed BMs per day without rectal bleeding or abdominal pain. She has presented with both clinical and endoscopic remission on current regimen, recent TCS as above. Ideally should be on monotherapy, however, she is maintained on methotrexate for rheumatologic reasons and will defer to rheumatology for continued management of this. She has no GI complaints today. She is up to date on all of her screening and monitoring labs.   PLAN:  -continue infliximab  5mg /kg Q8W -continue methotrexate at recommendations of  Rheumatology  -repeat TCS 3 years -patient to make  me aware of any new or changing GI issues   All questions were answered, patient verbalized understanding and is in agreement with plan as outlined above.   Follow Up: 1 year   Kristen Cooper L. Mariette, MSN, APRN, AGNP-C Adult-Gerontology Nurse Practitioner [facility]  I have reviewed the note and agree with the APP's assessment as described in this progress note  Toribio Fortune, MD Gastroenterology and Hepatology [facility]astroenterology

## 2024-05-06 ENCOUNTER — Encounter (INDEPENDENT_AMBULATORY_CARE_PROVIDER_SITE_OTHER): Payer: Self-pay | Admitting: Gastroenterology

## 2024-07-09 ENCOUNTER — Encounter: Payer: Self-pay | Admitting: Physical Therapy

## 2024-07-09 ENCOUNTER — Other Ambulatory Visit: Payer: Self-pay

## 2024-07-09 ENCOUNTER — Ambulatory Visit: Attending: General Practice | Admitting: Physical Therapy

## 2024-07-09 DIAGNOSIS — R293 Abnormal posture: Secondary | ICD-10-CM | POA: Insufficient documentation

## 2024-07-09 DIAGNOSIS — M62838 Other muscle spasm: Secondary | ICD-10-CM | POA: Diagnosis present

## 2024-07-09 DIAGNOSIS — R279 Unspecified lack of coordination: Secondary | ICD-10-CM | POA: Insufficient documentation

## 2024-07-09 NOTE — Therapy (Signed)
 OUTPATIENT PHYSICAL THERAPY FEMALE PELVIC EVALUATION   Patient Name: Kristen Cooper MRN: [id number] DOB:[birth year], 58 y.o., female Today's Date: 07/09/2024  END OF SESSION:  PT End of Session - 07/09/24 1317     Visit Number 1    Number of Visits 10    Date for Recertification  09/17/24    Authorization Type [employer]    Authorization - Visit Number 1    PT Start Time 1100    PT Stop Time 1145    PT Time Calculation (min) 45 min    Activity Tolerance Patient tolerated treatment well    Behavior During Therapy [facility] for tasks assessed/performed          Past Medical History:  Diagnosis Date   Arthritis    Crohn disease (HCC) GI-- dr setzer/ dr golda:   rheumologist-  dr mai   dx 1987   Dermatography 08/2012   To many histamine cells   GERD (gastroesophageal reflux disease)    History of kidney stones    Hypertension    Inflammatory bowel disease    Polymyositis (HCC)    Past Surgical History:  Procedure Laterality Date   APPENDECTOMY  1987   BIOPSY  08/09/2021   Procedure: BIOPSY;  Surgeon: Eartha Angelia Sieving, MD;  [facility];  Service: Gastroenterology;;   COLONOSCOPY N/A 10/27/2015   Procedure: COLONOSCOPY;  Surgeon: Claudis RAYMOND Golda, MD;  [facility]: [facility];  Service: Endoscopy;  Laterality: N/A;  11:00   COLONOSCOPY WITH PROPOFOL  N/A 08/09/2021   Procedure: COLONOSCOPY WITH PROPOFOL ;  Surgeon: Eartha Angelia Sieving, MD;  Location: [facility];  Service: Gastroenterology;  Laterality: N/A;  8:15   COLONOSCOPY WITH PROPOFOL  N/A 08/28/2023   Procedure: COLONOSCOPY WITH PROPOFOL ;  Surgeon: Eartha Angelia Sieving, MD;  [facility]: [facility];  Service: Gastroenterology;  Laterality: N/A;  1:15pm;asa 1   CYSTOSCOPY/URETEROSCOPY/HOLMIUM LASER/STENT PLACEMENT Left 01/13/2021   Procedure: CYSTOSCOPY/URETEROSCOPY/REMOVAL OF LEFT RENAL AND LEFT DISTAL STONES/STENT PLACEMENT;  Surgeon: Watt Rush, MD;  Location: [facility];  Service: Urology;   Laterality: Left;   ESOPHAGEAL DILATION N/A 03/03/2020   Procedure: ESOPHAGEAL DILATION;  Surgeon: Golda Claudis RAYMOND, MD;  [facility]: [facility];  Service: Endoscopy;  Laterality: N/A;   ESOPHAGOGASTRODUODENOSCOPY N/A 03/03/2020   Procedure: ESOPHAGOGASTRODUODENOSCOPY (EGD);  Surgeon: Golda Claudis RAYMOND, MD;  [facility]: [facility];  Service: Endoscopy;  Laterality: N/A;  1155   HEMOSTASIS CLIP PLACEMENT  08/28/2023   Procedure: HEMOSTASIS CLIP PLACEMENT;  Surgeon: Eartha Angelia Sieving, MD;  [facility]: [facility];  Service: Gastroenterology;;   MUSCLE BIOPSY Right 01/02/2017   Procedure: UPPER RIGHT  EXTREMITY MUSCLE BIOPSY;  Surgeon: Rubin Calamity, MD;  [facility];  Service: General;  Laterality: Right;   POLYPECTOMY  08/09/2021   Procedure: POLYPECTOMY;  Surgeon: Eartha Angelia Sieving, MD;  [facility];  Service: Gastroenterology;;   POLYPECTOMY  08/28/2023   Procedure: POLYPECTOMY;  Surgeon: Eartha Angelia Sieving, MD;  [facility];  Service: Gastroenterology;;   UPPER GASTROINTESTINAL ENDOSCOPY  ?   WISDOM TOOTH EXTRACTION     Patient Active Problem List   Diagnosis Date Noted   Kidney stones 12/19/2023   Vaginal atrophy 12/19/2023   Overactive bladder 12/19/2023   GERD (gastroesophageal reflux disease) 06/22/2021   Schatzki's ring 06/22/2021   Polymyositis (HCC) 03/03/2020   Polyarthritis 03/03/2020   Crohn disease (HCC) 09/21/2013    PCP: Brought Greig DEL, PA-C  REFERRING PROVIDER: Brought Greig DEL, PA-C  REFERRING DIAG: N94.2 (ICD-10-CM) - Vaginismus  THERAPY DIAG:  Other muscle spasm  Unspecified lack of coordination  Abnormal posture  Rationale for Evaluation and Treatment: Rehabilitation  ONSET DATE: chronic   SUBJECTIVE:                                                                                                                                                                                            SUBJECTIVE STATEMENT: Pt reports to PFPT with vaginismus that has been going on for a long time and has been affecting her marriage for 30 years. Tampon insertion, speculum insertion, finger penetration, and intercourse all create the same result. This sensation is a high pain level that feels tight/burning in nature. This pain started in the 90s when she was in college and got a pelvic exam for the first time and it was a traumatic experience for her. She has never been able to have full vaginal penetration with her husband and has never delivered any children. Fluid intake: lots of water  throughout the day; occasionally some decaf tea   FUNCTIONAL LIMITATIONS: Tampon insertion, speculum insertion, finger penetration, and intercourse  PERTINENT HISTORY:  Medications for current condition: no Surgeries: appendectomy 1987 Other: crohns disease, GERD, inflammatory bowel disease, polymyositis Sexual abuse: No  PAIN:  Are you having pain? Yes NPRS scale: 10/10 Pain location: at the introitus, and with deeper penetration which has not been achieved   Pain type: burning, sharp, and tight Pain description: intermittent   Aggravating factors: vaginal penetration  Relieving factors: propanolol for pelvic exams   PRECAUTIONS: None  RED FLAGS: None   WEIGHT BEARING RESTRICTIONS: No  FALLS:  Has patient fallen in last 6 months? No  OCCUPATION: retired  ACTIVITY LEVEL : walks 2.5-3 miles every day   PLOF: Independent  PATIENT GOALS: to get rid of the shame and guilt of pain during intercourse; be able to have intercourse; to get through pelvic exams with less pain   BOWEL MOVEMENT: Pain with bowel movement: No Type of bowel movement:Type (Bristol Stool Scale) 2-3, Frequency daily, Strain no, and Splinting no Fully empty rectum: Yes:   Leakage: No                                                  Caused by:  Bowel urgency: only when crohns is flared  Pads: No Fiber  supplement/laxative Yes - probiotic at night   URINATION: has had issues with kidney stones in the past  Pain with urination: No Fully empty bladder: Yes: for the most part                                          Post-void dribble: No Stream: Strong Urgency: Yes  Frequency:during the day within normal limits                                                         Nocturia: Yes: usually 2 times and she drinks a lot of water  before bed    Leakage: none Pads/briefs: No  INTERCOURSE:  Ability to have vaginal penetration Yes  Pain with intercourse: Initial Penetration, During Penetration, Deep Penetration, After Intercourse, and Pain Interrupts Intercourse - never had full depth of penetration  Dryness: Yes  Climax: no Marinoff Scale: 3/3 Lubricant: KY jelly doesn't remember what kind   PREGNANCY: Vaginal deliveries 0  PROLAPSE: None  OBJECTIVE:  Note: Objective measures were completed at Evaluation unless otherwise noted.  PATIENT SURVEYS:  POPIQ-7 100  COGNITION: Overall cognitive status: Within functional limits for tasks assessed     SENSATION: Light touch: Appears intact  LUMBAR SPECIAL TESTS:  Straight leg raise test: Positive  FUNCTIONAL TESTS:  Single leg stance:  Rt: +  Lt: +  Sit-up test: 2/3 Squat: within functional limits  Bed mobility: within functional limits   GAIT: Assistive device utilized: None Comments: within normal limits   POSTURE: rounded shoulders and forward head  LUMBARAROM/PROM: within normal limits for all motions tested bilaterally with no pain   LOWER EXTREMITY ROM: within functional limits for all motions tested bilaterally with no pain   LOWER EXTREMITY MMT: 4/5 bilateral knees and hips grossly   PALPATION:  General: slight tenderness to palpation of bilateral adductors in supine. Pt reports feeling slightly anxious when PT palpated hip flexors in supine due to the close nature of the pelvic floor  Pelvic Alignment: within  normal limits   Abdominal: abdominal bracing at rest   Diastasis: No Distortion: No  Breathing: apical breathing pattern with decreased lower rib excursion with inhalation  Scar tissue: Yes: appendectomy  Active Straight Leg Raise: + bilateral                 External Perineal Exam: not performed today due to pt request; to be assessed at first follow up visit                              Internal Pelvic Floor: not performed today due to pt request; to be assessed at first follow up visit   Patient confirms identification and approves PT to assess internal pelvic floor and treatment Yes No emotional/communication barriers or cognitive limitation. Patient is motivated to learn. Patient understands and agrees with treatment goals and plan. PT explains patient will be examined in standing, sitting, and lying down to see how their muscles and joints work. When they are ready, they will be asked to remove their underwear so PT can examine their perineum. The patient is also given the option of providing their own chaperone as one is not provided in our facility. The patient also has the right and is explained the  right to defer or refuse any part of the evaluation or treatment including the internal exam. With the patient's consent, PT will use one gloved finger to gently assess the muscles of the pelvic floor, seeing how well it contracts and relaxes and if there is muscle symmetry. After, the patient will get dressed and PT and patient will discuss exam findings and plan of care. PT and patient discuss plan of care, schedule, attendance policy and HEP activities. All internal or external pelvic floor assessments and/or treatments are completed with proper hand hygiene and gloves hands. If needed gloves are changed with hand hygiene during patient care time.  PELVIC MMT: not performed today due to pt request; to be assessed at first follow up visit    MMT eval  Vaginal   Internal Anal Sphincter    External Anal Sphincter   Puborectalis   (Blank rows = not tested)        TONE: not performed today due to pt request; to be assessed at first follow up visit   PROLAPSE: not performed today due to pt request; to be assessed at first follow up visit   TODAY'S TREATMENT:                                                                                                                              DATE:   EVAL 07/09/24: Examination completed, findings reviewed, pt educated on POC, HEP, and self care. Pt motivated to participate in PT and agreeable to attempt recommendations.   - Supine Diaphragmatic Breathing with Pelvic Floor Lengthening  - 1 x daily - 7 x weekly - 3 sets - 10 reps - Supine Butterfly Groin Stretch  - 1 x daily - 7 x weekly - 3 sets - hold - Supine Piriformis Stretch Pulling Heel to Hip  - 1 x daily - 7 x weekly - 3 sets - hold - Supine Lower Trunk Rotation  - 1 x daily - 7 x weekly - 3 sets - 10 reps Vaginal moisturizer samples provided   PATIENT EDUCATION:  Education details: relative anatomy and the connection between the diaphragm and pelvic floor; water  intake recommendation; vaginal moisturizer samples provided  Person educated: Patient Education method: Explanation, Demonstration, Tactile cues, Verbal cues, and Handouts Education comprehension: verbalized understanding, returned demonstration, verbal cues required, tactile cues required, and needs further education  HOME EXERCISE PROGRAM: Access Code: [email or web address] URL: [email or web address] Date: 07/09/2024 Prepared by: Celena Domino  Exercises - Supine Diaphragmatic Breathing with Pelvic Floor Lengthening  - 1 x daily - 7 x weekly - 3 sets - 10 reps - Supine Butterfly Groin Stretch  - 1 x daily - 7 x weekly - 3 sets - hold - Supine Piriformis Stretch Pulling Heel to Hip  - 1 x daily - 7 x weekly - 3 sets - hold - Supine Lower Trunk Rotation  - 1 x daily - 7 x weekly - 3 sets -  10  reps  Patient Education - Get To Know Your Pelvic Floor- Female - High-Fiber Diet to Support Pelvic Health  ASSESSMENT:  CLINICAL IMPRESSION: Patient is a 58 y.o. female  who was seen today for physical therapy evaluation and treatment for vaginismus. This has been a chronic issue for many years for the pt, since she initially had intercourse for the first time. Tampon insertion, speculum insertion, finger penetration, and intercourse all create the same result. This sensation is a high pain level that feels tight/burning in nature. This pain started in the 90s when she was in college and got a pelvic exam for the first time and it was a traumatic experience for her. She has never been able to have full vaginal penetration with her husband and has never delivered any children. Pt has been prescribed propranolol for her pelvic exams in the future but did not take it today and would like to delay her pelvic exam to the next visit. Pt demonstrates an apical breathing pattern with decreased rib excursion during diaphragmatic breathing and abdominal musculature restriction with breathing. She is tender around the adductor insertion site and reports feelings of uneasiness with palpation of hip flexors due to previous experiences with pelvic exams. She has lumbopelvic stiffness present with mild lateral hip weakness. Pt;s quality of life has been affected and she would like to be able to have normal intercourse with her husband which she has never bene able to do.Pt tolerated introduction of pelvic floor downtraining very well today and Pt would benefit from additional PT to further address deficits.      OBJECTIVE IMPAIRMENTS: decreased coordination, decreased endurance, decreased mobility, decreased ROM, decreased strength, and pain.   ACTIVITY LIMITATIONS: vaginal penetration   PARTICIPATION LIMITATIONS: gynecological exams, tampon insertion, pelvic exams, intercourse   PERSONAL FACTORS: Age,  Past/current experiences, and Time since onset of injury/illness/exacerbation are also affecting patient's functional outcome.   REHAB POTENTIAL: Good  CLINICAL DECISION MAKING: Stable/uncomplicated  EVALUATION COMPLEXITY: Low   GOALS: Goals reviewed with patient? Yes  SHORT TERM GOALS: Target date: 08/06/2024  Pt will be independent with HEP.  Baseline: Goal status: INITIAL  2.  Pt will be independent with diaphragmatic breathing and down training activities in order to improve pelvic floor relaxation. Baseline:  Goal status: INITIAL  3.  Pt will be independent with use of squatty potty, relaxed toileting mechanics, and improved bowel movement techniques in order to increase ease of bowel movements and complete evacuation.  Baseline:  Goal status: INITIAL  4. Pt will report confidence with dilator size 1 vaginal training to decrease vaginal canal muscle spasms with intercourse, pelvic exams, and gynecological exams to improve quality of life.   LONG TERM GOALS: Target date: 01/07/2025  Pt will be independent with advanced HEP.  Baseline:  Goal status: INITIAL  2.  Pt to demonstrate improved coordination of pelvic floor and breathing mechanics with 10# squat with appropriate synergistic patterns to decrease pain at least 75% of the time for improved ability to complete a 30 minute workout with strain at pelvic floor and symptoms.   Baseline:  Goal status: INITIAL  3.  Pt will report confidence with dilator size 1-3 vaginal training to decrease vaginal canal muscle spasms with intercourse, pelvic exams, and gynecological exams to improve quality of life.  Baseline:  Goal status: INITIAL  4.  Pt will be ind with using dilators for reducing pelvic floor tension and pain management so that she can comfortably have intercourse with her  husband. Baseline:  Goal status: INITIAL  PLAN:  PT FREQUENCY: 1-2x/week  PT DURATION: 6 months  PLANNED INTERVENTIONS: 97110-Therapeutic  exercises, 97530- Therapeutic activity, 97112- Neuromuscular re-education, 97535- Self Care, [id number]- Manual therapy, Patient/Family education, Taping, Joint mobilization, Spinal mobilization, Scar mobilization, Cryotherapy, Moist heat, and Biofeedback  PLAN FOR NEXT SESSION: pelvic floor examination; further pelvic floor downtraining; manual to adductors, lumbar spine, and glutes; dilator training   Celena JAYSON Domino, PT 07/09/2024, 1:18 PM

## 2024-08-18 ENCOUNTER — Ambulatory Visit: Admitting: Physical Therapy

## 2024-08-25 ENCOUNTER — Ambulatory Visit: Admitting: Physical Therapy

## 2024-09-01 ENCOUNTER — Ambulatory Visit: Admitting: Physical Therapy

## 2024-09-08 ENCOUNTER — Ambulatory Visit: Admitting: Physical Therapy

## 2024-09-15 ENCOUNTER — Ambulatory Visit: Admitting: Physical Therapy

## 2024-09-22 ENCOUNTER — Ambulatory Visit: Admitting: Physical Therapy

## 2024-12-21 ENCOUNTER — Ambulatory Visit: Admitting: Urology
